# Patient Record
Sex: Female | Born: 1985 | Race: Black or African American | Hispanic: No | Smoking: Never smoker
Health system: Southern US, Community
[De-identification: ages and names within clinical notes are randomized; demographics above are authoritative.]

## PROBLEM LIST (undated history)

## (undated) DIAGNOSIS — O039 Complete or unspecified spontaneous abortion without complication: Secondary | ICD-10-CM

---
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
## 2007-04-13 ENCOUNTER — Emergency Department (HOSPITAL_COMMUNITY): Admission: EM | Admit: 2007-04-13 | Discharge: 2007-04-13 | Payer: Self-pay | Admitting: Emergency Medicine

## 2007-10-19 ENCOUNTER — Emergency Department (HOSPITAL_COMMUNITY): Admission: EM | Admit: 2007-10-19 | Discharge: 2007-10-20 | Payer: Self-pay | Admitting: Emergency Medicine

## 2008-06-19 ENCOUNTER — Emergency Department (HOSPITAL_COMMUNITY): Admission: EM | Admit: 2008-06-19 | Discharge: 2008-06-19 | Payer: Self-pay | Admitting: Emergency Medicine

## 2008-11-15 ENCOUNTER — Emergency Department (HOSPITAL_COMMUNITY): Admission: EM | Admit: 2008-11-15 | Discharge: 2008-11-15 | Payer: Self-pay | Admitting: *Deleted

## 2009-02-05 ENCOUNTER — Emergency Department (HOSPITAL_COMMUNITY): Admission: EM | Admit: 2009-02-05 | Discharge: 2009-02-05 | Payer: Self-pay | Admitting: Family Medicine

## 2009-03-01 IMAGING — CR DG CERVICAL SPINE COMPLETE 4+V
6 series · 6 of 6 positions shown · non-contrast
Comparison: None.

CLINICAL DATA: Trauma.

Cervical spine 6 views

[w c-spine lat]
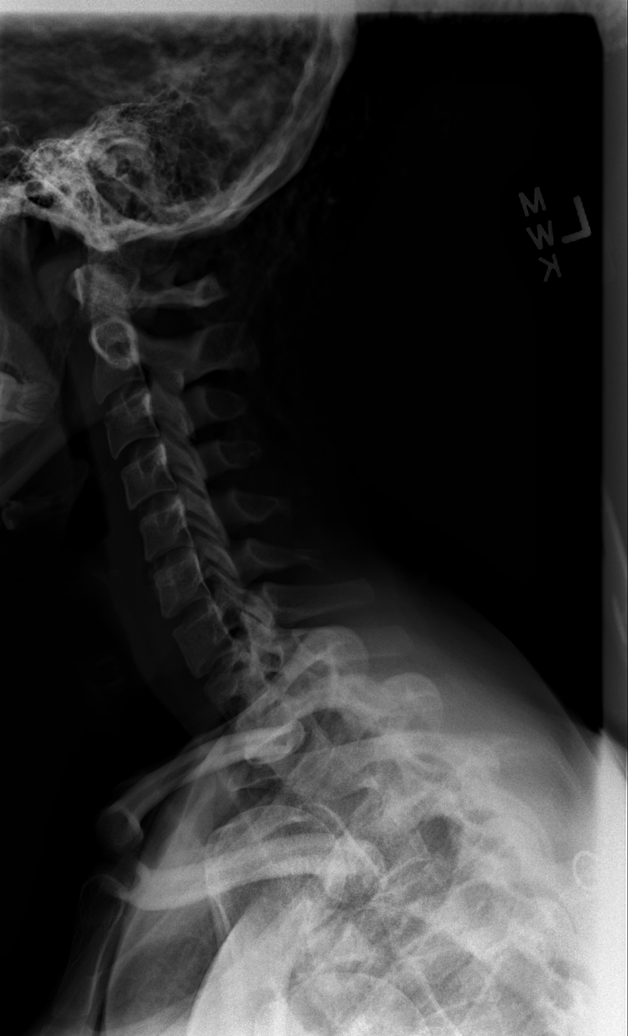

[w c-spine oblique (1 of 2)]
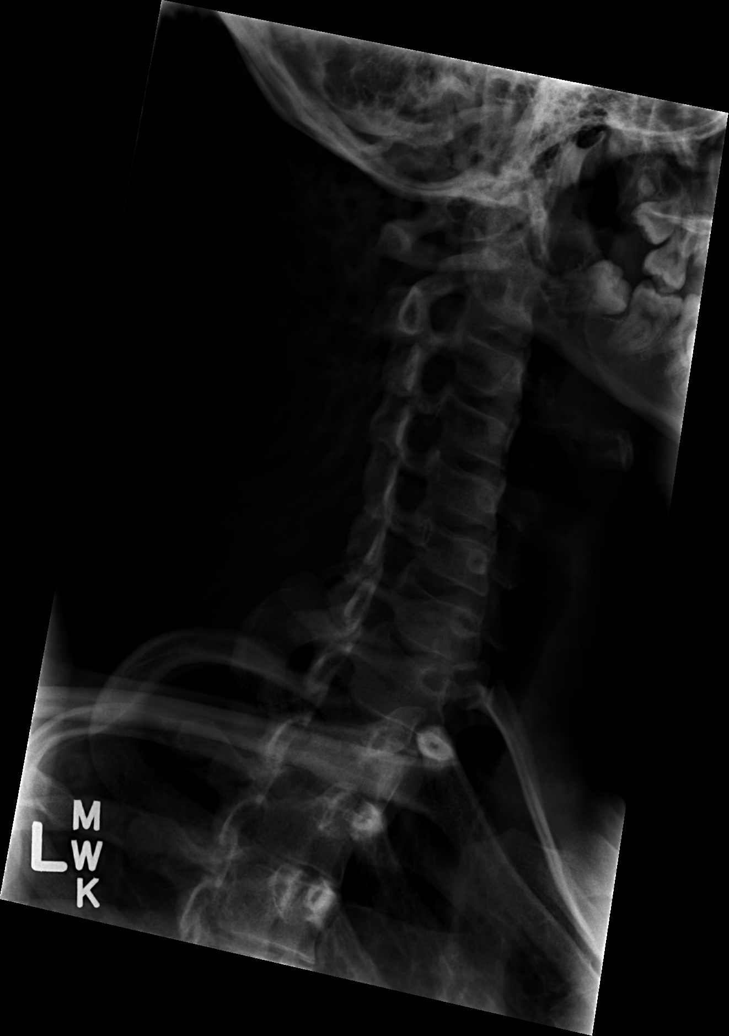

[w c-spine oblique (2 of 2)]
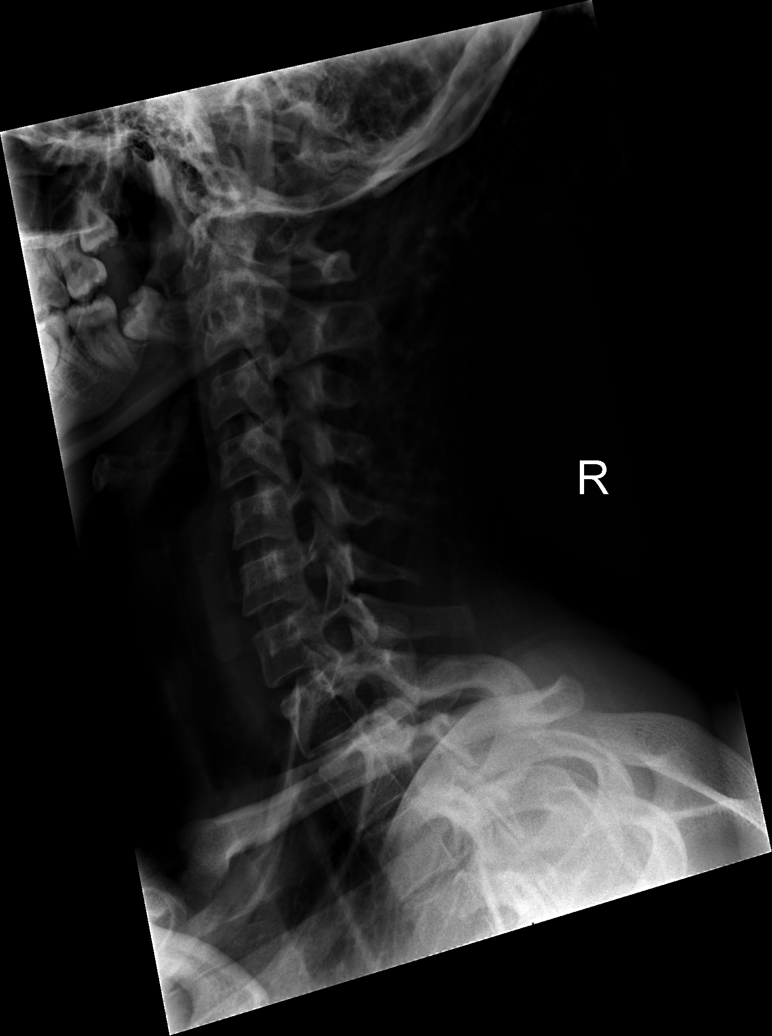

[w c-spine a.p.]
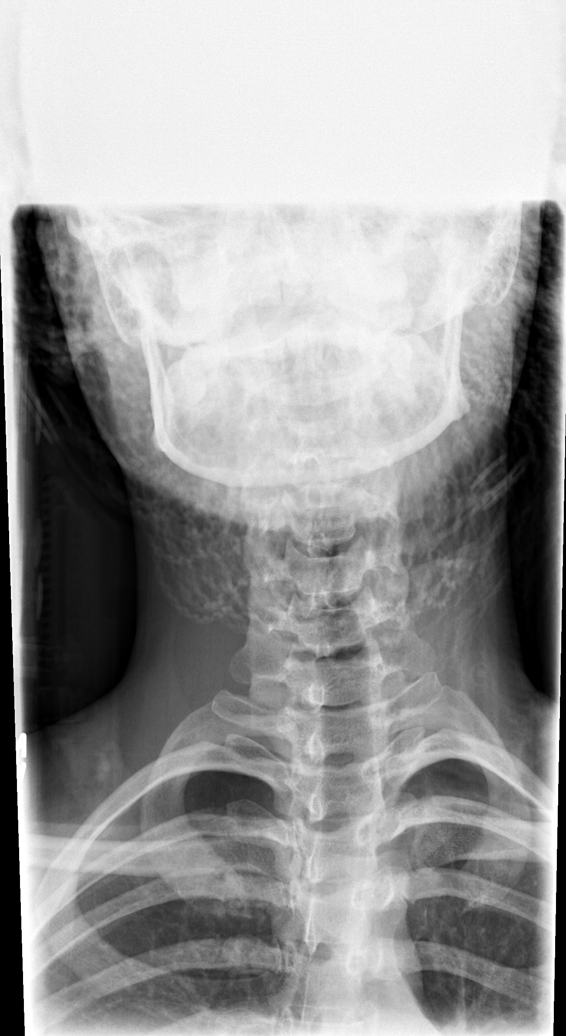

[w c-spine odontoid (1 of 2)]
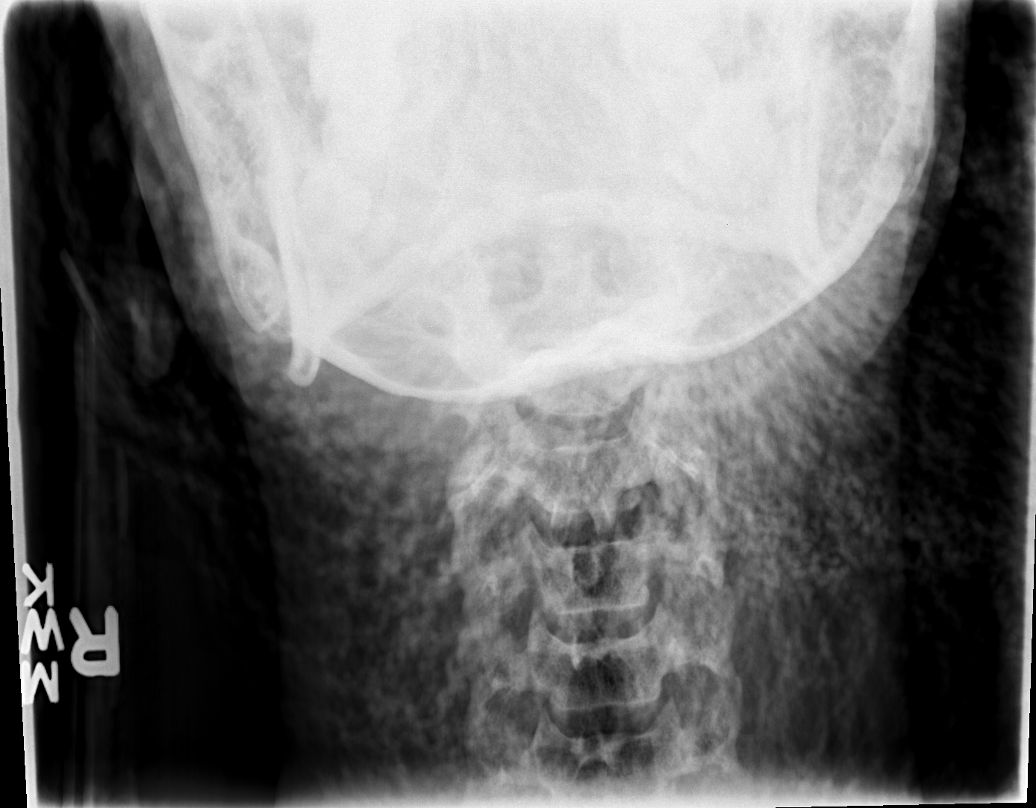

[w c-spine odontoid (2 of 2)]
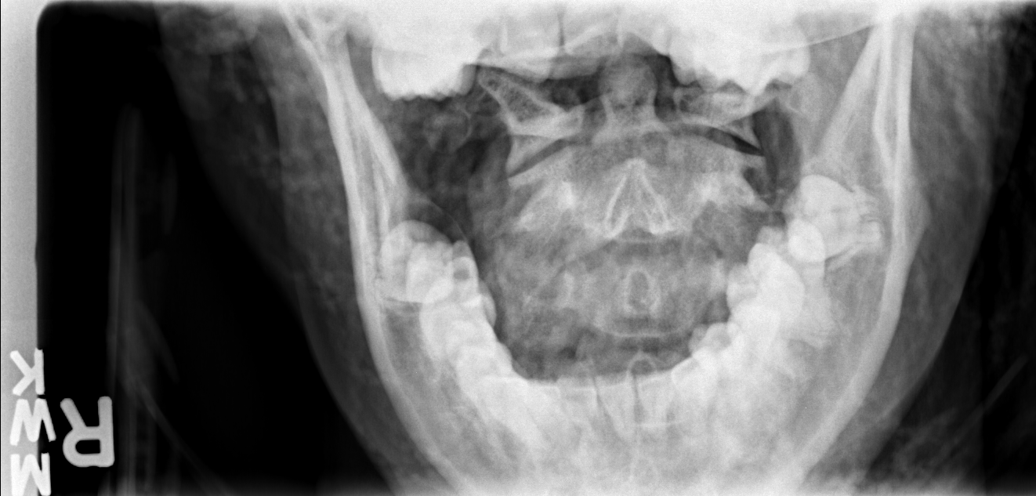

[6 of 6 positions shown; findings below may reference images not displayed]

FINDINGS: Prevertebral soft tissues normal. Oblique images partially artifact
limited. Superior aspect cervical spine also partially obscured by hair on
frontal view. Lateral masses symmetric. Body of C-2 intact. Odontoid process
intact.

IMPRESSION

2. Minimally limited oblique and frontal images as described.

## 2009-07-19 ENCOUNTER — Emergency Department (HOSPITAL_COMMUNITY): Admission: EM | Admit: 2009-07-19 | Discharge: 2009-07-19 | Payer: Self-pay | Admitting: Emergency Medicine

## 2009-08-01 ENCOUNTER — Emergency Department (HOSPITAL_COMMUNITY): Admission: EM | Admit: 2009-08-01 | Discharge: 2009-08-01 | Payer: Self-pay | Admitting: Emergency Medicine

## 2009-09-27 ENCOUNTER — Emergency Department (HOSPITAL_COMMUNITY): Admission: EM | Admit: 2009-09-27 | Discharge: 2009-09-27 | Payer: Self-pay | Admitting: Emergency Medicine

## 2011-03-26 LAB — STREP A DNA PROBE: Group A Strep Probe: NEGATIVE

## 2011-03-26 LAB — RAPID STREP SCREEN (MED CTR MEBANE ONLY): Streptococcus, Group A Screen (Direct): NEGATIVE

## 2011-03-29 LAB — POCT PREGNANCY, URINE: Preg Test, Ur: NEGATIVE

## 2011-03-29 LAB — RAPID STREP SCREEN (MED CTR MEBANE ONLY): Streptococcus, Group A Screen (Direct): NEGATIVE

## 2011-03-29 LAB — URINALYSIS, ROUTINE W REFLEX MICROSCOPIC
Glucose, UA: NEGATIVE mg/dL
Hgb urine dipstick: NEGATIVE

## 2011-04-07 LAB — POCT URINALYSIS DIP (DEVICE)
Bilirubin Urine: NEGATIVE
Ketones, ur: NEGATIVE mg/dL
Nitrite: POSITIVE — AB
Urobilinogen, UA: 0.2 mg/dL (ref 0.0–1.0)

## 2011-04-07 LAB — URINE CULTURE: Colony Count: >=100000

## 2011-04-07 LAB — WET PREP, GENITAL
Trich, Wet Prep: NONE SEEN
Yeast Wet Prep HPF POC: NONE SEEN

## 2011-09-22 LAB — URINE MICROSCOPIC-ADD ON

## 2011-09-22 LAB — URINALYSIS, ROUTINE W REFLEX MICROSCOPIC
Hgb urine dipstick: NEGATIVE
Ketones, ur: NEGATIVE
Protein, ur: NEGATIVE
Specific Gravity, Urine: 1.024
Urobilinogen, UA: 0.2
pH: 6

## 2011-09-30 LAB — STREP A DNA PROBE: Group A Strep Probe: NEGATIVE

## 2011-09-30 LAB — DIFFERENTIAL
Basophils Absolute: 0.4 — ABNORMAL HIGH
Basophils Relative: 2 — ABNORMAL HIGH
Eosinophils Absolute: 0
Eosinophils Relative: 0
Lymphocytes Relative: 9 — ABNORMAL LOW
Lymphs Abs: 1.6
Monocytes Absolute: 2 — ABNORMAL HIGH
Monocytes Relative: 11
Neutro Abs: 14.5 — ABNORMAL HIGH
Neutrophils Relative %: 78 — ABNORMAL HIGH

## 2011-09-30 LAB — CBC
HCT: 40.7
Hemoglobin: 13.6
MCHC: 33.5
MCV: 84.8
Platelets: 209
RBC: 4.8
RDW: 11.8
WBC: 18.5 — ABNORMAL HIGH

## 2011-09-30 LAB — RAPID STREP SCREEN (MED CTR MEBANE ONLY): Streptococcus, Group A Screen (Direct): NEGATIVE

## 2014-08-06 ENCOUNTER — Emergency Department (HOSPITAL_COMMUNITY)
Admission: EM | Admit: 2014-08-06 | Discharge: 2014-08-06 | Disposition: A | Discharge status: Home / Self Care | Payer: 59 | Attending: Emergency Medicine | Admitting: Emergency Medicine

## 2014-08-06 ENCOUNTER — Emergency Department (HOSPITAL_COMMUNITY): Payer: 59

## 2014-08-06 ENCOUNTER — Encounter (HOSPITAL_COMMUNITY): Payer: Self-pay | Admitting: Emergency Medicine

## 2014-08-06 DIAGNOSIS — R110 Nausea: Secondary | ICD-10-CM | POA: Diagnosis not present

## 2014-08-06 DIAGNOSIS — B9689 Other specified bacterial agents as the cause of diseases classified elsewhere: Secondary | ICD-10-CM | POA: Diagnosis not present

## 2014-08-06 DIAGNOSIS — O2390 Unspecified genitourinary tract infection in pregnancy, unspecified trimester: Secondary | ICD-10-CM | POA: Diagnosis not present

## 2014-08-06 DIAGNOSIS — A499 Bacterial infection, unspecified: Secondary | ICD-10-CM | POA: Insufficient documentation

## 2014-08-06 DIAGNOSIS — O9989 Other specified diseases and conditions complicating pregnancy, childbirth and the puerperium: Secondary | ICD-10-CM | POA: Insufficient documentation

## 2014-08-06 DIAGNOSIS — O200 Threatened abortion: Secondary | ICD-10-CM | POA: Diagnosis not present

## 2014-08-06 DIAGNOSIS — N939 Abnormal uterine and vaginal bleeding, unspecified: Secondary | ICD-10-CM

## 2014-08-06 DIAGNOSIS — N760 Acute vaginitis: Secondary | ICD-10-CM | POA: Insufficient documentation

## 2014-08-06 DIAGNOSIS — Z79899 Other long term (current) drug therapy: Secondary | ICD-10-CM | POA: Insufficient documentation

## 2014-08-06 DIAGNOSIS — Z331 Pregnant state, incidental: Secondary | ICD-10-CM

## 2014-08-06 DIAGNOSIS — Z3490 Encounter for supervision of normal pregnancy, unspecified, unspecified trimester: Secondary | ICD-10-CM

## 2014-08-06 LAB — CBC WITH DIFFERENTIAL/PLATELET
Basophils Absolute: 0 10*3/uL (ref 0.0–0.1)
Basophils Relative: 0 % (ref 0–1)
EOS ABS: 0 10*3/uL (ref 0.0–0.7)
Eosinophils Relative: 0 % (ref 0–5)
HCT: 39.7 % (ref 36.0–46.0)
Hemoglobin: 13.5 g/dL (ref 12.0–15.0)
LYMPHS ABS: 1.8 10*3/uL (ref 0.7–4.0)
Lymphocytes Relative: 16 % (ref 12–46)
MCH: 29.1 pg (ref 26.0–34.0)
MCHC: 34 g/dL (ref 30.0–36.0)
MCV: 85.6 fL (ref 78.0–100.0)
MONOS PCT: 6 % (ref 3–12)
Monocytes Absolute: 0.6 10*3/uL (ref 0.1–1.0)
NEUTROS PCT: 78 % — AB (ref 43–77)
Neutro Abs: 8.5 10*3/uL — ABNORMAL HIGH (ref 1.7–7.7)
Platelets: 240 10*3/uL (ref 150–400)
RBC: 4.64 MIL/uL (ref 3.87–5.11)
RDW: 12.6 % (ref 11.5–15.5)
WBC: 10.9 10*3/uL — ABNORMAL HIGH (ref 4.0–10.5)

## 2014-08-06 LAB — COMPREHENSIVE METABOLIC PANEL
ALK PHOS: 53 U/L (ref 39–117)
ALT: 21 U/L (ref 0–35)
ANION GAP: 13 (ref 5–15)
AST: 28 U/L (ref 0–37)
Albumin: 3.9 g/dL (ref 3.5–5.2)
BILIRUBIN TOTAL: 0.3 mg/dL (ref 0.3–1.2)
BUN: 6 mg/dL (ref 6–23)
CO2: 23 mEq/L (ref 19–32)
CREATININE: 0.76 mg/dL (ref 0.50–1.10)
Calcium: 9.7 mg/dL (ref 8.4–10.5)
Chloride: 105 mEq/L (ref 96–112)
GFR calc non Af Amer: >90 mL/min (ref >90)
GLUCOSE: 72 mg/dL (ref 70–99)
POTASSIUM: 3.8 meq/L (ref 3.7–5.3)
Sodium: 141 mEq/L (ref 137–147)
TOTAL PROTEIN: 7.6 g/dL (ref 6.0–8.3)

## 2014-08-06 LAB — HIV ANTIBODY (ROUTINE TESTING W REFLEX): HIV 1&2 Ab, 4th Generation: NONREACTIVE

## 2014-08-06 LAB — URINALYSIS, ROUTINE W REFLEX MICROSCOPIC
BILIRUBIN URINE: NEGATIVE
GLUCOSE, UA: NEGATIVE mg/dL
LEUKOCYTES UA: NEGATIVE
Nitrite: NEGATIVE
PROTEIN: NEGATIVE mg/dL
Specific Gravity, Urine: 1.021 (ref 1.005–1.030)
UROBILINOGEN UA: 0.2 mg/dL (ref 0.0–1.0)
pH: 5.5 (ref 5.0–8.0)

## 2014-08-06 LAB — WET PREP, GENITAL
Trich, Wet Prep: NONE SEEN
Yeast Wet Prep HPF POC: NONE SEEN

## 2014-08-06 LAB — HCG, QUANTITATIVE, PREGNANCY: hCG, Beta Chain, Quant, S: 9474 m[IU]/mL — ABNORMAL HIGH (ref <5)

## 2014-08-06 LAB — URINE MICROSCOPIC-ADD ON

## 2014-08-06 LAB — ABO/RH: ABO/RH(D): A POS

## 2014-08-06 LAB — POC URINE PREG, ED: Preg Test, Ur: POSITIVE — AB

## 2014-08-06 LAB — RPR

## 2014-08-06 MED ORDER — METRONIDAZOLE 500 MG PO TABS: 500.0000 mg | ORAL_TABLET | Freq: Two times a day (BID) | ORAL | Status: AC

## 2014-08-06 MED ORDER — PRENATAL COMPLETE 14-0.4 MG PO TABS: 1.0000 | ORAL_TABLET | Freq: Every day | ORAL | Status: AC

## 2014-08-06 NOTE — ED Notes (Signed)
Pelvic cart set up at bedside  

## 2014-08-06 NOTE — ED Notes (Signed)
Pt reports having abd cramps and irregular period. Last menstrual was 7/5 but it was heavier than normal for her and only lasted two days. Then had spotting last week. Pt wants pregnancy test. No acute distress noted at triage.

## 2014-08-06 NOTE — Discharge Instructions (Signed)
Your vaginal bleeding is considered a threatened miscarriage, but your ultrasound shows an early intrauterine pregnancy. Drink plenty of fluids, and start prenatal vitamins today. You had bacterial vaginosis on your swabs today, therefore start taking metronidazole as directed and finish the full course. Avoid intercourse during this course of medications. You need to see the women's outpatient clinic in 2 days for a repeat beta-HCG (pregnancy hormone) and prenatal care. Follow up with women's outpatient for future STD concerns or screenings. You have been tested for gonorrhea and chlamydia in the ER but the hospital will call you if lab is positive and direct you on any further treatment you may need. You were tested for HIV and Syphilis, and the hospital will call you if the lab is positive. Return to the ER for any changes or worsening symptoms, including large blood loss and passage of tissues from your vagina.   Bacterial Vaginosis Bacterial vaginosis is an infection of the vagina. It happens when too many of certain germs (bacteria) grow in the vagina. HOME CARE  Take your medicine as told by your doctor.  Finish your medicine even if you start to feel better.  Do not have sex until you finish your medicine and are better.  Tell your sex partner that you have an infection. They should see their doctor for treatment.  Practice safe sex. Use condoms. Have only one sex partner. GET HELP IF:  You are not getting better after 3 days of treatment.  You have more grey fluid (discharge) coming from your vagina than before.  You have more pain than before.  You have a fever. MAKE SURE YOU:   Understand these instructions.  Will watch your condition.  Will get help right away if you are not doing well or get worse. Document Released: 09/15/2008 Document Revised: 09/27/2013 Document Reviewed: 07/19/2013 [facility] Patient Information 2015 [place], [place]. This information is not intended to  replace advice given to you by your health care provider. Make sure you discuss any questions you have with your health care provider.  First Trimester of Pregnancy The first trimester of pregnancy is from week 1 until the end of week 12 (months 1 through 3). During this time, your baby will begin to develop inside you. At 6-8 weeks, the eyes and face are formed, and the heartbeat can be seen on ultrasound. At the end of 12 weeks, all the baby's organs are formed. Prenatal care is all the medical care you receive before the birth of your baby. Make sure you get good prenatal care and follow all of your doctor's instructions. HOME CARE  Medicines  Take medicine only as told by your doctor. Some medicines are safe and some are not during pregnancy.  Take your prenatal vitamins as told by your doctor.  Take medicine that helps you poop (stool softener) as needed if your doctor says it is okay. Diet  Eat regular, healthy meals.  Your doctor will tell you the amount of weight gain that is right for you.  Avoid raw meat and uncooked cheese.  If you feel sick to your stomach (nauseous) or throw up (vomit):  Eat 4 or 5 small meals a day instead of 3 large meals.  Try eating a few soda crackers.  Drink liquids between meals instead of during meals.  If you have a hard time pooping (constipation):  Eat high-fiber foods like fresh vegetables, fruit, and whole grains.  Drink enough fluids to keep your pee (urine) clear or pale  yellow. Activity and Exercise  Exercise only as told by your doctor. Stop exercising if you have cramps or pain in your lower belly (abdomen) or low back.  Try to avoid standing for long periods of time. Move your legs often if you must stand in one place for a long time.  Avoid heavy lifting.  Wear low-heeled shoes. Sit and stand up straight.  You can have sex unless your doctor tells you not to. Relief of Pain or Discomfort  Wear a good support bra if your  breasts are sore.  Take warm water baths (sitz baths) to soothe pain or discomfort caused by hemorrhoids. Use hemorrhoid cream if your doctor says it is okay.  Rest with your legs raised if you have leg cramps or low back pain.  Wear support hose if you have puffy, bulging veins (varicose veins) in your legs. Raise (elevate) your feet for 15 minutes, 3-4 times a day. Limit salt in your diet. Prenatal Care  Schedule your prenatal visits by the twelfth week of pregnancy.  Write down your questions. Take them to your prenatal visits.  Keep all your prenatal visits as told by your doctor. Safety  Wear your seat belt at all times when driving.  Make a list of emergency phone numbers. The list should include numbers for family, friends, the hospital, and police and fire departments. General Tips  Ask your doctor for a referral to a local prenatal class. Begin classes no later than at the start of month 6 of your pregnancy.  Ask for help if you need counseling or help with nutrition. Your doctor can give you advice or tell you where to go for help.  Do not use hot tubs, steam rooms, or saunas.  Do not douche or use tampons or scented sanitary pads.  Do not cross your legs for long periods of time.  Avoid litter boxes and soil used by cats.  Avoid all smoking, herbs, and alcohol. Avoid drugs not approved by your doctor.  Visit your dentist. At home, brush your teeth with a soft toothbrush. Be gentle when you floss. GET HELP IF:  You are dizzy.  You have mild cramps or pressure in your lower belly.  You have a nagging pain in your belly area.  You continue to feel sick to your stomach, throw up, or have watery poop (diarrhea).  You have a bad smelling fluid coming from your vagina.  You have pain with peeing (urination).  You have increased puffiness (swelling) in your face, hands, legs, or ankles. GET HELP RIGHT AWAY IF:   You have a fever.  You are leaking fluid from  your vagina.  You have spotting or bleeding from your vagina.  You have very bad belly cramping or pain.  You gain or lose weight rapidly.  You throw up blood. It may look like coffee grounds.  You are around people who have [place] measles, fifth disease, or chickenpox.  You have a very bad headache.  You have shortness of breath.  You have any kind of trauma, such as from a fall or a car accident. Document Released: 05/25/2008 Document Revised: 04/23/2014 Document Reviewed: 10/17/2013 [facility] Patient Information 2015 [place], [place]. This information is not intended to replace advice given to you by your health care provider. Make sure you discuss any questions you have with your health care provider.  Threatened Miscarriage A threatened miscarriage is when you have vaginal bleeding during your first 20 weeks of pregnancy but the  pregnancy has not ended. Your doctor will do tests to make sure you are still pregnant. The cause of the bleeding may not be known. This condition does not mean your pregnancy will end. It does increase the risk of it ending (complete miscarriage). HOME CARE   Make sure you keep all your doctor visits for prenatal care.  Get plenty of rest.  Do not have sex or use tampons if you have vaginal bleeding.  Do not douche.  Do not smoke or use drugs.  Do not drink alcohol.  Avoid caffeine. GET HELP IF:  You have light bleeding from your vagina.  You have belly pain or cramping.  You have a fever. GET HELP RIGHT AWAY IF:   You have heavy bleeding from your vagina.  You have clots of blood coming from your vagina.  You have bad pain or cramps in your low back or belly.  You have fever, chills, and bad belly pain. MAKE SURE YOU:   Understand these instructions.  Will watch your condition.  Will get help right away if you are not doing well or get worse. Document Released: 11/19/2008 Document Revised: 12/12/2013 Document Reviewed:  10/03/2013 [facility] Patient Information 2015 [place]. This information is not intended to replace advice given to you by your health care provider. Make sure you discuss any questions you have with your health care provider.  Vaginal Bleeding During Pregnancy, First Trimester A small amount of bleeding (spotting) from the vagina is relatively common in early pregnancy. It usually stops on its own. Various things may cause bleeding or spotting in early pregnancy. Some bleeding may be related to the pregnancy, and some may not. In most cases, the bleeding is normal and is not a problem. However, bleeding can also be a sign of something serious. Be sure to tell your health care provider about any vaginal bleeding right away. Some possible causes of vaginal bleeding during the first trimester include:  Infection or inflammation of the cervix.  Growths (polyps) on the cervix.  Miscarriage or threatened miscarriage.  Pregnancy tissue has developed outside of the uterus and in a fallopian tube (tubal pregnancy).  Tiny cysts have developed in the uterus instead of pregnancy tissue (molar pregnancy). HOME CARE INSTRUCTIONS  Watch your condition for any changes. The following actions may help to lessen any discomfort you are feeling:  Follow your health care provider's instructions for limiting your activity. If your health care provider orders bed rest, you may need to stay in bed and only get up to use the bathroom. However, your health care provider may allow you to continue light activity.  If needed, make plans for someone to help with your regular activities and responsibilities while you are on bed rest.  Keep track of the number of pads you use each day, how often you change pads, and how soaked (saturated) they are. Write this down.  Do not use tampons. Do not douche.  Do not have sexual intercourse or orgasms until approved by your health care provider.  If you pass any tissue  from your vagina, save the tissue so you can show it to your health care provider.  Only take over-the-counter or prescription medicines as directed by your health care provider.  Do not take aspirin because it can make you bleed.  Keep all follow-up appointments as directed by your health care provider. SEEK MEDICAL CARE IF:  You have any vaginal bleeding during any part of your pregnancy.  You have  cramps or labor pains.  You have a fever, not controlled by medicine. SEEK IMMEDIATE MEDICAL CARE IF:   You have severe cramps in your back or belly (abdomen).  You pass large clots or tissue from your vagina.  Your bleeding increases.  You feel light-headed or weak, or you have fainting episodes.  You have chills.  You are leaking fluid or have a gush of fluid from your vagina.  You pass out while having a bowel movement. MAKE SURE YOU:  Understand these instructions.  Will watch your condition.  Will get help right away if you are not doing well or get worse. Document Released: 09/16/2005 Document Revised: 12/12/2013 Document Reviewed: 08/14/2013 [facility] Patient Information 2015 [place], [place]. This information is not intended to replace advice given to you by your health care provider. Make sure you discuss any questions you have with your health care provider.

## 2014-08-06 NOTE — ED Provider Notes (Signed)
Medical screening examination/treatment/procedure(s) were performed by non-physician practitioner and as supervising physician I was immediately available for consultation/collaboration.   EKG Interpretation None      Devoria AlbeIva Jaquasha Carnevale, MD, Armando GangFACEP   Ward GivensIva L Capone Schwinn, MD 08/06/14 [phone]

## 2014-08-06 NOTE — ED Provider Notes (Signed)
CSN: [id number]     Arrival date & time 08/06/14  1038 History   First MD Initiated Contact with Patient 08/06/14 1109     Chief Complaint  Patient presents with  . Abdominal Pain     (Consider location/radiation/quality/duration/timing/severity/associated sxs/prior Treatment) HPI Comments: Electra Paladino is a 28 y.o. Female with a PMHx of a miscarriage last February, who presents to the ED today with complaints of irregular periods, intermittent crampy abd pain, and unsure of pregnancy status, but when she went to urinate after arrival she noticed bright red blood. States the abd pain was 8/10, intermittent, nonradiating, located in lower abd, with no known aggravating or alleviating factors, and began approx 2wks ago. States she had felt intermittently nauseated therefore she thought she was pregnant which was what prompted her to come today. Then upon arrival, she developed mild vaginal bleeding, less than menses, bright red, with a small clot. LMP 06/24/14. Has one sexual partner, has unprotected sex. States she's had vaginal discharge over the last few weeks but can't describe it and says it "comes and goes". Endorses dyspareunia 2 days ago, stating it was dull pain. Denies fevers, chills, HA, CP, SOB, diarrhea, constipation, urinary symptoms, melena, hematochezia, NSAID use, alcohol use, recent travel or changes in diet, myalgias, arthralgias, syncope, or lightheadedness. States this feels similar to her prior miscarriage.  Patient is a 28 y.o. female presenting with abdominal pain. The history is provided by the patient. No language interpreter was used.  Abdominal Pain Pain location:  LLQ and RLQ Pain quality: cramping   Pain radiates to:  Does not radiate Pain severity:  Moderate (8/10) Onset quality:  Gradual Duration:  2 weeks Timing:  Intermittent Progression:  Unchanged Chronicity:  New Context: not eating, not previous surgeries, not recent sexual activity, not sick contacts and  not suspicious food intake   Relieved by:  None tried Worsened by:  Nothing tried Ineffective treatments:  None tried Associated symptoms: nausea (intermittent, now resolved), vaginal bleeding and vaginal discharge   Associated symptoms: no chest pain, no chills, no constipation, no diarrhea, no dysuria, no fever, no flatus, no hematemesis, no hematochezia, no hematuria, no melena, no shortness of breath and no vomiting   Vaginal bleeding:    Quality:  Bright red   Severity:  Mild   Number of pads used:  1   Number of tampons used:  0   Onset quality:  Sudden   Duration:  1 hour   Progression:  Unchanged   Chronicity:  New Vaginal discharge:    Quality:  Unable to specify   Severity:  Mild   Onset quality:  Unable to specify   Duration: unsure.   Timing:  Sporadic   Progression:  Unable to specify   Chronicity:  New Risk factors: no NSAID use     History reviewed. No pertinent past medical history. History reviewed. No pertinent past surgical history. History reviewed. No pertinent family history. History  Substance Use Topics  . Smoking status: Not on file  . Smokeless tobacco: Not on file  . Alcohol Use: No   OB History   Grav Para Term Preterm Abortions TAB SAB Ect Mult Living                 Review of Systems  Constitutional: Negative for fever and chills.  Respiratory: Negative for shortness of breath.   Cardiovascular: Negative for chest pain.  Gastrointestinal: Positive for nausea (intermittent, now resolved) and abdominal pain. Negative for vomiting, diarrhea,  constipation, blood in stool, melena, hematochezia, abdominal distention, anal bleeding, rectal pain, flatus and hematemesis.  Genitourinary: Positive for vaginal bleeding, vaginal discharge, vaginal pain, menstrual problem and dyspareunia. Negative for dysuria, urgency, frequency, hematuria, flank pain, decreased urine volume and difficulty urinating.  Musculoskeletal: Negative for arthralgias, back pain,  myalgias and neck pain.  Skin: Negative for color change and pallor.  Neurological: Negative for dizziness, weakness and light-headedness.  Hematological: Does not bruise/bleed easily.  Psychiatric/Behavioral: Negative for confusion.  10 Systems reviewed and are negative for acute change except as noted in the HPI.     Allergies  Review of patient's allergies indicates no known allergies.  Home Medications   Prior to Admission medications   Medication Sig Start Date End Date Taking? Authorizing Provider  metroNIDAZOLE (FLAGYL) 500 MG tablet Take 1 tablet (500 mg total) by mouth 2 (two) times daily. One po bid x 7 days 08/06/14   Donnita FallsMercedes Strupp Camprubi-Soms, PA-C  Prenatal Vit-Fe Fumarate-FA (PRENATAL COMPLETE) 14-0.4 MG TABS Take 1 tablet by mouth daily after breakfast. 08/06/14   Donnita FallsMercedes Strupp Camprubi-Soms, PA-C   BP 146/99  Pulse 77  Temp(Src) 99.1 F (37.3 C) (Oral)  Resp 18  SpO2 100%  LMP 06/24/2014 Physical Exam  Nursing note and vitals reviewed. Constitutional: She is oriented to person, place, and time. Vital signs are normal. She appears well-developed and well-nourished.  Non-toxic appearance. She appears distressed.  Oral temp 99.47F, nontoxic, tearful and appears upset  HENT:  Head: Normocephalic and atraumatic.  Mouth/Throat: Mucous membranes are normal.  Eyes: Conjunctivae and EOM are normal. Right eye exhibits no discharge. Left eye exhibits no discharge.  Neck: Normal range of motion. Neck supple.  Cardiovascular: Normal rate, regular rhythm, normal heart sounds and intact distal pulses.  Exam reveals no gallop and no friction rub.   No murmur heard. Pulmonary/Chest: Effort normal and breath sounds normal. No respiratory distress. She has no wheezes. She has no rhonchi. She has no rales.  Abdominal: Soft. Normal appearance and bowel sounds are normal. She exhibits no distension. There is no tenderness. There is no rigidity, no rebound, no guarding, no CVA  tenderness, no tenderness at McBurney's point and negative Murphy's sign.  Soft, NT/ND, +BS throughout, no r/g/r, neg murphy's, neg mcburney's, neg CVA TTP  Genitourinary: Uterus normal. There is no rash, tenderness or lesion on the right labia. There is no rash, tenderness or lesion on the left labia. Cervix exhibits discharge. Cervix exhibits no motion tenderness and no friability. Right adnexum displays no mass, no tenderness and no fullness. Left adnexum displays no mass, no tenderness and no fullness. There is bleeding around the vagina. No erythema or tenderness around the vagina. No foreign body around the vagina. No signs of injury around the vagina. Vaginal discharge found.  No rashes, lesions, or tenderness to external genitalia. No erythema, injury, or tenderness to vaginal mucosa. Mucoid vaginal discharge and scant blood within vaginal vault. No adnexal masses, tenderness, or fullness. No CMT or cervical friability. Mucoid discharge from cervical os which is closed. Uterus non-deviated, mobile, nonTTP, and without enlargement.    Musculoskeletal: Normal range of motion.  Neurological: She is alert and oriented to person, place, and time.  Skin: Skin is warm, dry and intact. No rash noted.  Psychiatric: She has a normal mood and affect.    ED Course  Procedures (including critical care time) Labs Review Labs Reviewed  WET PREP, GENITAL - Abnormal; Notable for the following:    Clue Cells Wet  Prep HPF POC MODERATE (*)    WBC, Wet Prep HPF POC FEW (*)    All other components within normal limits  URINALYSIS, ROUTINE W REFLEX MICROSCOPIC - Abnormal; Notable for the following:    Hgb urine dipstick LARGE (*)    Ketones, ur >80 (*)    All other components within normal limits  CBC WITH DIFFERENTIAL - Abnormal; Notable for the following:    WBC 10.9 (*)    Neutrophils Relative % 78 (*)    Neutro Abs 8.5 (*)    All other components within normal limits  HCG, QUANTITATIVE, PREGNANCY -  Abnormal; Notable for the following:    hCG, Beta Chain, Quant, S 9474 (*)    All other components within normal limits  URINE MICROSCOPIC-ADD ON - Abnormal; Notable for the following:    Bacteria, UA FEW (*)    All other components within normal limits  POC URINE PREG, ED - Abnormal; Notable for the following:    Preg Test, Ur POSITIVE (*)    All other components within normal limits  GC/CHLAMYDIA PROBE AMP  URINE CULTURE  COMPREHENSIVE METABOLIC PANEL  RPR  HIV ANTIBODY (ROUTINE TESTING)  ABO/RH    Imaging Review US Ob Comp Less 14 Wks  08/06/2014   CLINICAL DATA:  Pregnant with vaginal bleeding. Ectopic verses miscarriage.  EXAM: OBSTETRIC <14 WK [place] AND TRANSVAGINAL OB US  TECHNIQUE: Both transabdominal and transvaginal ultrasound examinations were performed for complete evaluation of the gestation as well as the maternal uterus, adnexal regions, and pelvic cul-de-sac. Transvaginal technique was performed to assess early pregnancy.  COMPARISON:  None.  FINDINGS: Intrauterine gestational sac: Visualized/normal in shape.  Yolk sac:  Present  Embryo:  Absent  Cardiac Activity: Absent  Heart Rate: Not applicable bpm  MSD: 7.6  mm   5 w   4  d  [place] EDC: 04/04/2015  Maternal uterus/adnexae: Physiologic appearance of the ovaries. No subchorionic hemorrhage. Moderate amount of free fluid.  IMPRESSION: Uncomplicated single intrauterine pregnancy with gestational sac and yolk sac visualized. Fetal pole not visualized due to early dates.   Electronically Signed   By: Andreas Newport M.D.   On: 08/06/2014 13:09   US Ob Transvaginal  08/06/2014   CLINICAL DATA:  Pregnant with vaginal bleeding. Ectopic verses miscarriage.  EXAM: OBSTETRIC <14 WK [place] AND TRANSVAGINAL OB US  TECHNIQUE: Both transabdominal and transvaginal ultrasound examinations were performed for complete evaluation of the gestation as well as the maternal uterus, adnexal regions, and pelvic cul-de-sac. Transvaginal technique was performed  to assess early pregnancy.  COMPARISON:  None.  FINDINGS: Intrauterine gestational sac: Visualized/normal in shape.  Yolk sac:  Present  Embryo:  Absent  Cardiac Activity: Absent  Heart Rate: Not applicable bpm  MSD: 7.6  mm   5 w   4  d  [place] EDC: 04/04/2015  Maternal uterus/adnexae: Physiologic appearance of the ovaries. No subchorionic hemorrhage. Moderate amount of free fluid.  IMPRESSION: Uncomplicated single intrauterine pregnancy with gestational sac and yolk sac visualized. Fetal pole not visualized due to early dates.   Electronically Signed   By: Andreas Newport M.D.   On: 08/06/2014 13:09     EKG Interpretation None      MDM   Final diagnoses:  Threatened abortion in early pregnancy  Intrauterine pregnancy, incidental  BV (bacterial vaginosis)  Vaginal bleeding     28y/o female with irregular menses who then developed vaginal bleeding after arrival. Concerned for miscarriage. Will check basic labs,  U/A, Upreg, pelvic exam with wet prep, GC/CT swab, and STD panel. Likely will need vaginal U/S either to r/o pelvic conditions or to r/o ectopic vs retained products of conception. Will get IV started in case vaginal bleeding becomes unstable, but pt with stable VS at this time. Will monitor.  12:45 PM Upreg positive, will proceed with U/S. Pelvic exam demonstrates mild amount of mucoid discharge with closed cervical os. No concerning findings that would indicate GC/CT.  1:50 PM Wet prep showing BV, will rx metronidazole 500 BIDx 7d. CMP WNL, CBC with diff WNL with stable H/H and stable VS, no concern for large volume blood loss. U/A showing leuk neg, nitrite neg, few bacteria and 0-2 WBC, likely related to BV therefore will not treat today and will send for culture, doubt UTI. ABO/Rh is A+, no rhogam necessary today. Quant Hcg [id number], discussed the need to repeat this in 2 days to watch for appropriate rise. Will have pt f/up at [facility], and advised her to start prenatals and  encouraged fluids. I explained the diagnosis and have given explicit precautions to return to the ER including for any other new or worsening symptoms. The patient understands and accepts the medical plan as it's been dictated and I have answered their questions. Discharge instructions concerning home care and prescriptions have been given. The patient is STABLE and is discharged to home in good condition.   BP 127/76  Pulse 82  Temp(Src) 99.1 F (37.3 C) (Oral)  Resp 16  SpO2 100%  LMP 06/24/2014  Meds ordered this encounter  Medications  . Prenatal Vit-Fe Fumarate-FA (PRENATAL COMPLETE) 14-0.4 MG TABS    Sig: Take 1 tablet by mouth daily after breakfast.    Dispense:  30 each    Refill:  2    Order Specific Question:  Supervising Provider    Answer:  Eber Hong D [[id number]]  . metroNIDAZOLE (FLAGYL) 500 MG tablet    Sig: Take 1 tablet (500 mg total) by mouth 2 (two) times daily. One po bid x 7 days    Dispense:  14 tablet    Refill:  0    Order Specific Question:  Supervising Provider    Answer:  Eber Hong D [[id number]]     Liala Codispoti Butler [place] Camprubi-Soms, PA-C 08/06/14 1402

## 2014-08-06 NOTE — ED Notes (Signed)
Pt. Reports irregular menstrual periods, N/V, and abdominal cramping. States last year she had a miscarriage and states symptoms are similar. Pt. Anxious. Reports having vaginal bleeding now.

## 2014-08-07 LAB — URINE CULTURE
Colony Count: 45000
Special Requests: NORMAL

## 2014-08-07 LAB — GC/CHLAMYDIA PROBE AMP
CT Probe RNA: NEGATIVE
GC Probe RNA: NEGATIVE

## 2014-08-08 ENCOUNTER — Other Ambulatory Visit: Payer: 59

## 2014-08-08 DIAGNOSIS — N939 Abnormal uterine and vaginal bleeding, unspecified: Secondary | ICD-10-CM

## 2014-08-09 ENCOUNTER — Other Ambulatory Visit: Payer: Self-pay | Admitting: Advanced Practice Midwife

## 2014-08-09 ENCOUNTER — Encounter: Payer: Self-pay | Admitting: *Deleted

## 2014-08-09 DIAGNOSIS — O3680X1 Pregnancy with inconclusive fetal viability, fetus 1: Secondary | ICD-10-CM

## 2014-08-09 LAB — HCG, QUANTITATIVE, PREGNANCY: HCG, BETA CHAIN, QUANT, S: 17925 m[IU]/mL

## 2014-08-09 NOTE — Progress Notes (Signed)
Appropriate rise in quants. IUP confirmed. Offer viability/dating US in ~7 days or [facility]

## 2014-08-09 NOTE — Progress Notes (Addendum)
Called pt and informed her of rise in hormone level indicating she has a viable pregnancy. She may have ultrasound next week (if desired) to establish her due date. Pt stated she would like that. I scheduled the US appt for 8/27 @ 0945 and advised pt.  Pt would like to begin prenatal care in this office. I advised her that we are not taking pts that are less than 15 wks at this time, however we do recommend that she begin care before that if possible. I offered her to begin prenatal care at the [place] location [place] and she agreed. I stated that I will send a message to that office and she will receive a call with appt details. Pt also asked if she can be written out of work for the first trimester because she has a high risk pregnancy and a very stressful job. She stated that she just lost a pregnancy earlier this year and does not want to lose another. I advised pt that she may discuss further with the provider once she begins prenatal care. At this time, there is no medical reason to keep her out of work. She then stated that she had stayed out of work this week and asked for a letter of excuse for her job. I stated that I can prepare a letter to excuse her from work on 08/06/14 because she was seen @ the ED. The provider notes did not state that she was not able to work after the ED visit. Pt voiced understanding of all information and instructions given and will call back tomorrow with the fax number for her job so that we can send the letter. Letter composed in EPIC and ready for faxing.

## 2014-08-13 ENCOUNTER — Encounter: Payer: Self-pay | Admitting: *Deleted

## 2014-08-15 ENCOUNTER — Emergency Department (HOSPITAL_COMMUNITY)
Admission: EM | Admit: 2014-08-15 | Discharge: 2014-08-15 | Disposition: A | Discharge status: Home / Self Care | Payer: 59 | Attending: Emergency Medicine | Admitting: Emergency Medicine

## 2014-08-15 ENCOUNTER — Emergency Department (HOSPITAL_COMMUNITY): Payer: 59

## 2014-08-15 ENCOUNTER — Encounter (HOSPITAL_COMMUNITY): Payer: Self-pay | Admitting: Emergency Medicine

## 2014-08-15 DIAGNOSIS — N898 Other specified noninflammatory disorders of vagina: Secondary | ICD-10-CM | POA: Diagnosis present

## 2014-08-15 DIAGNOSIS — Z792 Long term (current) use of antibiotics: Secondary | ICD-10-CM | POA: Insufficient documentation

## 2014-08-15 DIAGNOSIS — Z79899 Other long term (current) drug therapy: Secondary | ICD-10-CM | POA: Diagnosis not present

## 2014-08-15 DIAGNOSIS — O039 Complete or unspecified spontaneous abortion without complication: Secondary | ICD-10-CM | POA: Diagnosis not present

## 2014-08-15 HISTORY — DX: Complete or unspecified spontaneous abortion without complication: O03.9

## 2014-08-15 LAB — CBC WITH DIFFERENTIAL/PLATELET
BASOS PCT: 0 % (ref 0–1)
Basophils Absolute: 0 10*3/uL (ref 0.0–0.1)
Eosinophils Absolute: 0.1 10*3/uL (ref 0.0–0.7)
Eosinophils Relative: 1 % (ref 0–5)
HCT: 36.3 % (ref 36.0–46.0)
HEMOGLOBIN: 12.4 g/dL (ref 12.0–15.0)
LYMPHS ABS: 2.6 10*3/uL (ref 0.7–4.0)
Lymphocytes Relative: 26 % (ref 12–46)
MCH: 28.8 pg (ref 26.0–34.0)
MCHC: 34.2 g/dL (ref 30.0–36.0)
MCV: 84.4 fL (ref 78.0–100.0)
MONOS PCT: 9 % (ref 3–12)
Monocytes Absolute: 0.9 10*3/uL (ref 0.1–1.0)
NEUTROS PCT: 64 % (ref 43–77)
Neutro Abs: 6.4 10*3/uL (ref 1.7–7.7)
Platelets: 248 10*3/uL (ref 150–400)
RBC: 4.3 MIL/uL (ref 3.87–5.11)
RDW: 12.7 % (ref 11.5–15.5)
WBC: 9.9 10*3/uL (ref 4.0–10.5)

## 2014-08-15 LAB — HCG, QUANTITATIVE, PREGNANCY: hCG, Beta Chain, Quant, S: 34916 m[IU]/mL — ABNORMAL HIGH (ref <5)

## 2014-08-15 LAB — TYPE AND SCREEN
ABO/RH(D): A POS
Antibody Screen: NEGATIVE

## 2014-08-15 MED ORDER — MORPHINE SULFATE 4 MG/ML IJ SOLN
4.0000 mg | Freq: Once | INTRAMUSCULAR | Status: AC
  Administered 2014-08-15: 4 mg via INTRAVENOUS
  Filled 2014-08-15: qty 1

## 2014-08-15 MED ORDER — HYDROCODONE-ACETAMINOPHEN 5-325 MG PO TABS
1.0000 | ORAL_TABLET | Freq: Once | ORAL | Status: AC
  Administered 2014-08-15: 1 via ORAL
  Filled 2014-08-15: qty 1

## 2014-08-15 MED ORDER — HYDROCODONE-ACETAMINOPHEN 5-325 MG PO TABS: 1.0000 | ORAL_TABLET | Freq: Four times a day (QID) | ORAL | Status: AC | PRN

## 2014-08-15 NOTE — ED Notes (Signed)
Patient still off the unit for testing 

## 2014-08-15 NOTE — Discharge Instructions (Signed)
Miscarriage A miscarriage is the sudden loss of an unborn baby (fetus) before the 20th week of pregnancy. Most miscarriages happen in the first 3 months of pregnancy. Sometimes, it happens before a woman even knows she is pregnant. A miscarriage is also called a "spontaneous miscarriage" or "early pregnancy loss." Having a miscarriage can be an emotional experience. Talk with your caregiver about any questions you may have about miscarrying, the grieving process, and your future pregnancy plans. CAUSES   Problems with the fetal chromosomes that make it impossible for the baby to develop normally. Problems with the baby's genes or chromosomes are most often the result of errors that occur, by chance, as the embryo divides and grows. The problems are not inherited from the parents.  Infection of the cervix or uterus.   Hormone problems.   Problems with the cervix, such as having an incompetent cervix. This is when the tissue in the cervix is not strong enough to hold the pregnancy.   Problems with the uterus, such as an abnormally shaped uterus, uterine fibroids, or congenital abnormalities.   Certain medical conditions.   Smoking, drinking alcohol, or taking illegal drugs.   Trauma.  Often, the cause of a miscarriage is unknown.  SYMPTOMS   Vaginal bleeding or spotting, with or without cramps or pain.  Pain or cramping in the abdomen or lower back.  Passing fluid, tissue, or blood clots from the vagina. DIAGNOSIS  Your caregiver will perform a physical exam. You may also have an ultrasound to confirm the miscarriage. Blood or urine tests may also be ordered. TREATMENT   Sometimes, treatment is not necessary if you naturally pass all the fetal tissue that was in the uterus. If some of the fetus or placenta remains in the body (incomplete miscarriage), tissue left behind may become infected and must be removed. Usually, a dilation and curettage (D and C) procedure is performed.  During a D and C procedure, the cervix is widened (dilated) and any remaining fetal or placental tissue is gently removed from the uterus.  Antibiotic medicines are prescribed if there is an infection. Other medicines may be given to reduce the size of the uterus (contract) if there is a lot of bleeding.  If you have Rh negative blood and your baby was Rh positive, you will need a Rh immunoglobulin shot. This shot will protect any future baby from having Rh blood problems in future pregnancies. HOME CARE INSTRUCTIONS   Your caregiver may order bed rest or may allow you to continue light activity. Resume activity as directed by your caregiver.  Have someone help with home and family responsibilities during this time.   Keep track of the number of sanitary pads you use each day and how soaked (saturated) they are. Write down this information.   Do not use tampons. Do not douche or have sexual intercourse until approved by your caregiver.   Only take over-the-counter or prescription medicines for pain or discomfort as directed by your caregiver.   Do not take aspirin. Aspirin can cause bleeding.   Keep all follow-up appointments with your caregiver.   If you or your partner have problems with grieving, talk to your caregiver or seek counseling to help cope with the pregnancy loss. Allow enough time to grieve before trying to get pregnant again.  SEEK IMMEDIATE MEDICAL CARE IF:   You have severe cramps or pain in your back or abdomen.  You have a fever.  You pass large blood clots (walnut-sized   or larger) ortissue from your vagina. Save any tissue for your caregiver to inspect.   Your bleeding increases.   You have a thick, bad-smelling vaginal discharge.  You become lightheaded, weak, or you faint.   You have chills.  MAKE SURE YOU:  Understand these instructions.  Will watch your condition.  Will get help right away if you are not doing well or get  worse. Document Released: 06/02/2001 Document Revised: 04/03/2013 Document Reviewed: 01/26/2012 [employer] Patient Information 2015 [employer]. This information is not intended to replace advice given to you by your health care provider. Make sure you discuss any questions you have with your health care provider.  

## 2014-08-15 NOTE — ED Notes (Signed)
D/c I/v 

## 2014-08-15 NOTE — ED Provider Notes (Signed)
CSN: [id number]     Arrival date & time 08/15/14  0505 History   First MD Initiated Contact with Patient 08/15/14 [phone]     Chief Complaint  Patient presents with  . Vaginal Bleeding     (Consider location/radiation/quality/duration/timing/severity/associated sxs/prior Treatment) HPI  This is a 28 year old G50P0 female approximately [redacted] weeks pregnant who presents with vaginal bleeding. Patient reports onset of vaginal bleeding and abdominal cramping earlier this evening. She reports she has passed several large blood clots. She denies any syncope or dizziness. She reports lower crampy abdominal pain that is nonradiating. She is due to see OB  Tomorrow. History of spontaneous miscarriage. Was evaluated on August 17 and had a single intrauterine pregnancy at that time.  Past Medical History  Diagnosis Date  . Miscarriage    History reviewed. No pertinent past surgical history. History reviewed. No pertinent family history. History  Substance Use Topics  . Smoking status: Never Smoker   . Smokeless tobacco: Not on file  . Alcohol Use: No   OB History   Grav Para Term Preterm Abortions TAB SAB Ect Mult Living                 Review of Systems  Constitutional: Negative for fever.  Respiratory: Negative for cough, chest tightness and shortness of breath.   Cardiovascular: Negative for chest pain.  Gastrointestinal: Positive for abdominal pain. Negative for nausea and vomiting.  Genitourinary: Positive for vaginal bleeding. Negative for dysuria.  Neurological: Negative for headaches.  All other systems reviewed and are negative.     Allergies  Review of patient's allergies indicates no known allergies.  Home Medications   Prior to Admission medications   Medication Sig Start Date End Date Taking? Authorizing Provider  Prenatal Vit-Fe Fumarate-FA (PRENATAL COMPLETE) 14-0.4 MG TABS Take 1 tablet by mouth daily after breakfast. 08/06/14  Yes Mercedes Strupp Camprubi-Soms, PA-C   HYDROcodone-acetaminophen (NORCO/VICODIN) 5-325 MG per tablet Take 1-2 tablets by mouth every 6 (six) hours as needed for moderate pain or severe pain. 08/15/14   Shon Baton, MD  metroNIDAZOLE (FLAGYL) 500 MG tablet Take 1 tablet (500 mg total) by mouth 2 (two) times daily. One po bid x 7 days 08/06/14   Donnita Falls Camprubi-Soms, PA-C   BP 115/65  Pulse 69  Temp(Src) 98.5 F (36.9 C) (Oral)  Resp 18  Ht  (1.6 m)  Wt 130 lb (58.968 kg)  BMI 23.03 kg/m2  SpO2 100%  LMP 06/24/2014 Physical Exam  Nursing note and vitals reviewed. Constitutional: She is oriented to person, place, and time. She appears well-developed and well-nourished. No distress.  HENT:  Head: Normocephalic and atraumatic.  Cardiovascular: Normal rate, regular rhythm and normal heart sounds.   Pulmonary/Chest: Effort normal and breath sounds normal. No respiratory distress. She has no wheezes.  Abdominal: Soft. Bowel sounds are normal. There is tenderness. There is no rebound and no guarding.  Genitourinary:  Normal external vaginal exam, large amount of gross blood noted in the vaginal vault, evacuated multiple blood clots with ringed forceps, cervical os open  Neurological: She is alert and oriented to person, place, and time.  Skin: Skin is warm and dry.  Psychiatric: She has a normal mood and affect.    ED Course  Procedures (including critical care time) Labs Review Labs Reviewed  HCG, QUANTITATIVE, PREGNANCY - Abnormal; Notable for the following:    hCG, Beta Chain, Quant, S [id number] (*)    All other components within normal limits  CBC WITH DIFFERENTIAL  TYPE AND SCREEN    Imaging Review US Ob Comp Less 14 Wks  08/15/2014   CLINICAL DATA:  Vaginal bleeding  EXAM: OBSTETRIC <14 WK [place] AND TRANSVAGINAL OB US  TECHNIQUE: Study was performed transabdominally to optimize pelvic field of view evaluation and transvaginally to optimize internal visceral architecture evaluation.  COMPARISON:  August 06, 2014  FINDINGS: The previously noted gestational sac is no longer appreciable. The endometrium currently is thickened with complex echogenic material within the endometrium consistent with hemorrhage. Complex fluid collection in the endometrium measures 1.8 x 1.4 x 1.5 cm. A degree of retained products of conception cannot be excluded on this study. Uterus otherwise appears unremarkable. Uterus is retroverted. It measures 7.3 x 5.7 x 7.0 cm.  The right ovary measures 2.0 x 1.3 x 1.3 cm. Left ovary measures 3.5 x 2.1 x 2.0 cm. There is a follicle/corpus luteum in the left ovary measuring 1.6 x 0.7 cm. No other extrauterine pelvic mass. A small amount of free pelvic fluid may indicate recent ovarian cyst leakage.  IMPRESSION: Findings consistent with spontaneous abortion. Gestational sac no longer appreciable. There is complex material throughout the endometrium consistent with hemorrhage in possible retained products of conception. Close clinical evaluation with beta HCG correlation advised. A repeat ultrasound in 2-3 days to further assess the appearance of the endometrium may be warranted given this circumstance as well.   Electronically Signed   By: Bretta Bang M.D.   On: 08/15/2014 07:27   US Ob Transvaginal  08/15/2014   CLINICAL DATA:  Vaginal bleeding  EXAM: OBSTETRIC <14 WK [place] AND TRANSVAGINAL OB US  TECHNIQUE: Study was performed transabdominally to optimize pelvic field of view evaluation and transvaginally to optimize internal visceral architecture evaluation.  COMPARISON:  August 06, 2014  FINDINGS: The previously noted gestational sac is no longer appreciable. The endometrium currently is thickened with complex echogenic material within the endometrium consistent with hemorrhage. Complex fluid collection in the endometrium measures 1.8 x 1.4 x 1.5 cm. A degree of retained products of conception cannot be excluded on this study. Uterus otherwise appears unremarkable. Uterus is retroverted. It  measures 7.3 x 5.7 x 7.0 cm.  The right ovary measures 2.0 x 1.3 x 1.3 cm. Left ovary measures 3.5 x 2.1 x 2.0 cm. There is a follicle/corpus luteum in the left ovary measuring 1.6 x 0.7 cm. No other extrauterine pelvic mass. A small amount of free pelvic fluid may indicate recent ovarian cyst leakage.  IMPRESSION: Findings consistent with spontaneous abortion. Gestational sac no longer appreciable. There is complex material throughout the endometrium consistent with hemorrhage in possible retained products of conception. Close clinical evaluation with beta HCG correlation advised. A repeat ultrasound in 2-3 days to further assess the appearance of the endometrium may be warranted given this circumstance as well.   Electronically Signed   By: Bretta Bang M.D.   On: 08/15/2014 07:27     EKG Interpretation None      MDM   Final diagnoses:  Miscarriage    Patient presents with vaginal bleeding in the setting of recently confirmed IUP.  Denies dizziness or syncope.  Active passage of clot on my exam with manual evacuation and open os.  Suspect spontaneous AB.  Patient is RH+.  DOes not require rhogam.  Patient given pain medication.  US obtained with confirm - [place] with evidence of likely spontaneous AB.  Discussed results with patient.  Patient's bleeding has subsided and Hbg is  12.4 down from 13.5.  Discussed with patient pain control and expectant management.  Patient is to follow-up with OB as scheduled for further eval.  GIven return precautions including worsening bleeding (>2 pads/hr), worsening pain.  Patient stated understanding.  After history, exam, and medical workup I feel the patient has been appropriately medically screened and is safe for discharge home. Pertinent diagnoses were discussed with the patient. Patient was given return precautions.     Shon Baton, MD 08/15/14 [id number]

## 2014-08-15 NOTE — ED Notes (Signed)
Boyfriend present at the nursing station, informed him that the patient is currrently in ultrasound, and that her account is private. He is currently the only person she has authorized to visit.  He is awaiting her return in her room.

## 2014-08-15 NOTE — ED Notes (Signed)
Charge nurse called to room as patient requested.  Patient prefers only experienced staff at the bedside.

## 2014-08-15 NOTE — ED Notes (Signed)
Pt c/o increased bleeding and pain.

## 2014-08-15 NOTE — ED Notes (Signed)
Patient reports she noticed she was passing large blood clots and was having stomach cramps.  She reports she was having these same symptoms the last time she miscarried.  She is unsure of how far along she is, and that she hasn't seen an OB physician yet.

## 2014-08-15 NOTE — ED Notes (Signed)
Pelvic cart at the bedside 

## 2014-08-15 NOTE — ED Notes (Signed)
Brought Patient towels

## 2014-08-15 NOTE — ED Notes (Signed)
Called lab and blood bank to follow up with type and screen.  It is currently in process. Reported this to Dr. Wilkie Aye. Also discussed with Charge RN that the patient is concerned about lack of empathy from staff, and requests to see charge again.  Lequita Halt will see the patient once she returns from ultrasound.

## 2014-08-15 NOTE — ED Notes (Signed)
Pt stated "are you expecting me to leave while passing clots, this is poor care."  This RN advised that this was a normal process and that we would send her home with extra pads until she got in to see her OB.  Pt and family were extremely unreceptive to this RN and Toni Amend, RN to the idea of having to be discharge.  MD Woffard advised of the pt concerns.  Prior to MD going to bedside pt is leaving still wearing her hospital gown.  Pt left a clot on the floor for nursing staff prior to her leaving the ED when she was provided green pads to use while she was in the bed.

## 2014-08-15 NOTE — ED Notes (Signed)
Gave Patient towels, wash cloths, basin, body wash, tooth brush and tooth paste with pad and underwear.

## 2014-08-15 NOTE — ED Notes (Signed)
Boyfriend inquires to go to ultrasound with the patient. Called to see if possible.  Ultrasound reports patient will be returning soon.

## 2014-08-15 NOTE — ED Notes (Signed)
Patient requested to have account made private.  Reviewed policy for limited visitors and password is last 4 of CSN number: [id number].  She acknowledges.

## 2014-08-16 ENCOUNTER — Ambulatory Visit (HOSPITAL_COMMUNITY): Payer: 59 | Attending: Advanced Practice Midwife

## 2014-08-28 ENCOUNTER — Telehealth: Payer: Self-pay | Admitting: *Deleted

## 2014-08-28 NOTE — Telephone Encounter (Signed)
Marleda from radiology scheduling called stating that this pt was attempting to reschedule her missed [place] appt from 8/27. Leticia Penna was not sure it is necessary because pt was seen @ [facility] on 8/26 and found to have miscarriage. After review of pt's EMR, I informed Leticia Penna that pt should have the [place] rescheduled because her scan on 8/26 while at the ED showed the possibility of retained products and pt did not have follow up care scheduled. Leticia Penna will call pt to schedule [place] appt.

## 2016-06-24 IMAGING — US US OB TRANSVAGINAL
1 series · 14 of 28 positions shown · non-contrast
Comparison: None.

CLINICAL DATA: Pregnant with vaginal bleeding. Ectopic verses
miscarriage.

EXAM:
OBSTETRIC <14 WK US AND TRANSVAGINAL OB US
TECHNIQUE: Both transabdominal and transvaginal ultrasound examinations were
performed for complete evaluation of the gestation as well as the
maternal uterus, adnexal regions, and pelvic cul-de-sac.
Transvaginal technique was performed to assess early pregnancy.

[Series 1: us ob transvaginal · 0.22mm/px · 14 of 41 slices shown]
[im 2/41]
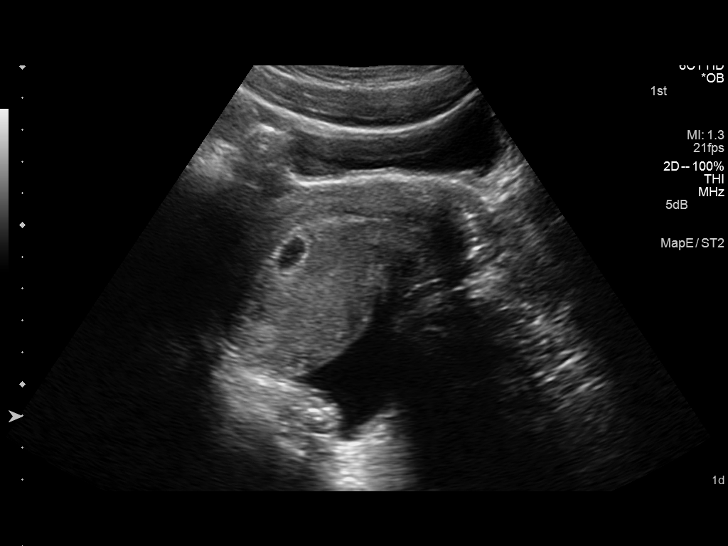
[im 5/41]
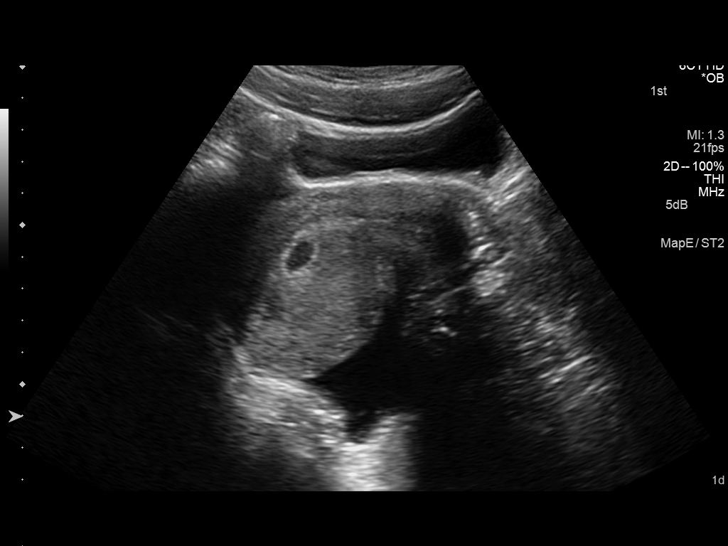
[im 8/41]
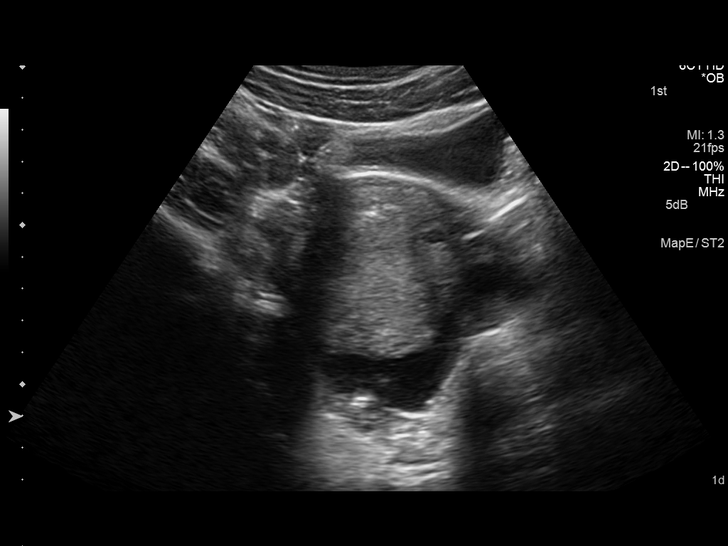
[im 11/41]
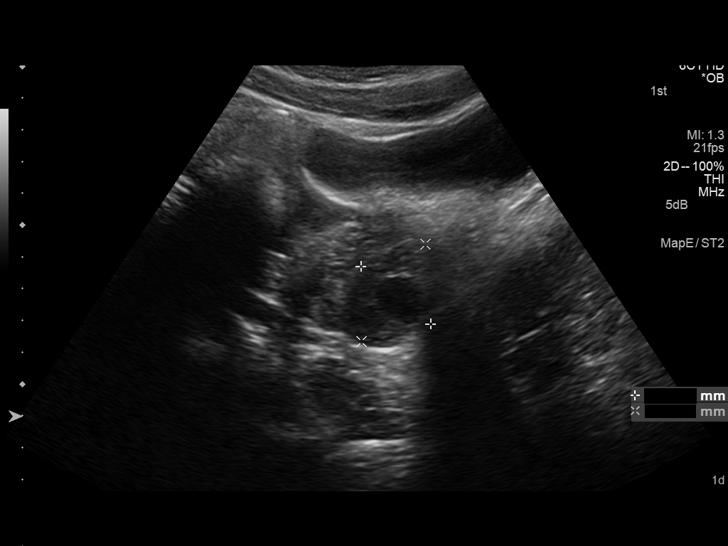
[im 14/41]
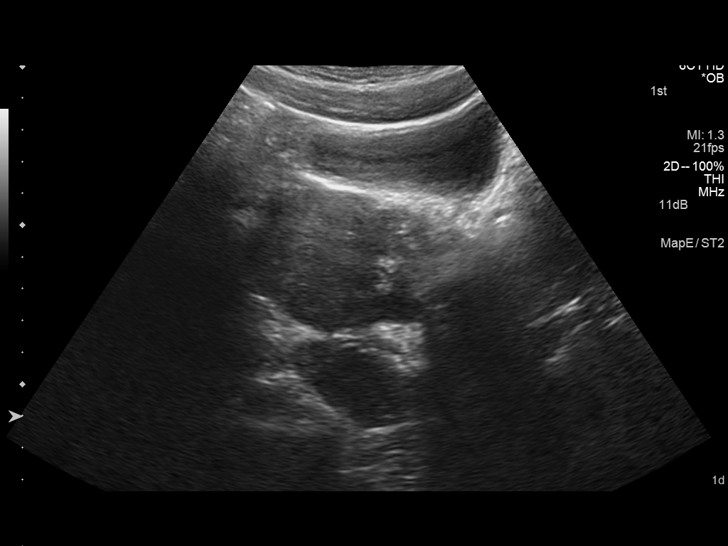
[im 17/41]
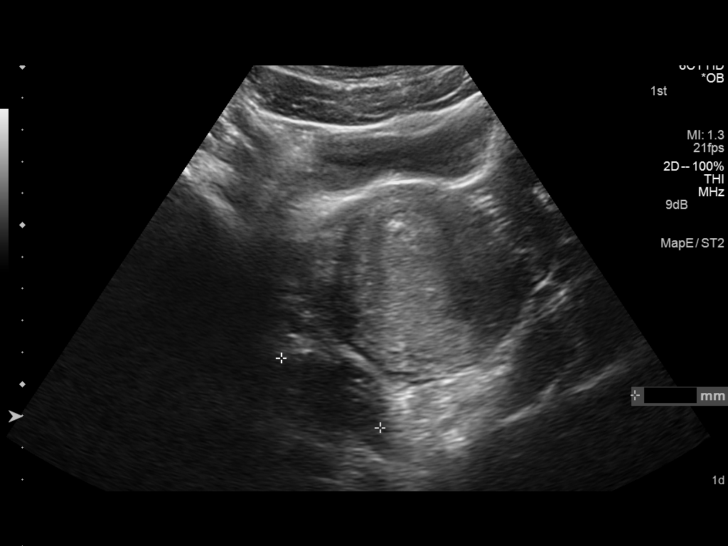
[im 20/41]
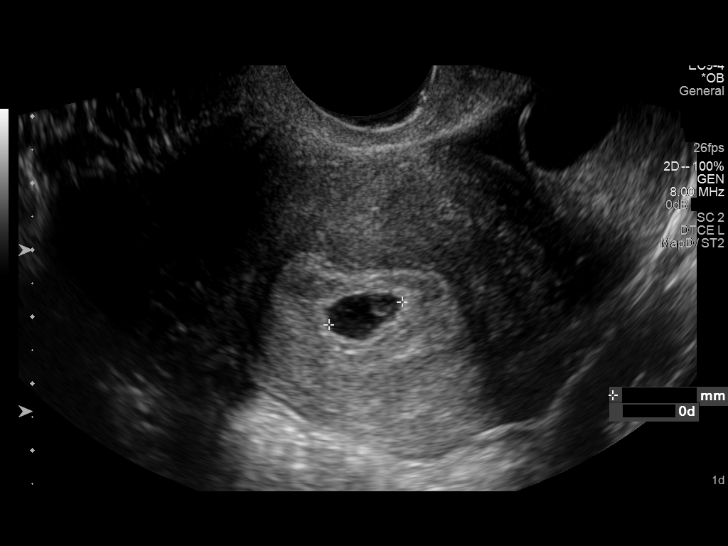
[im 23/41]
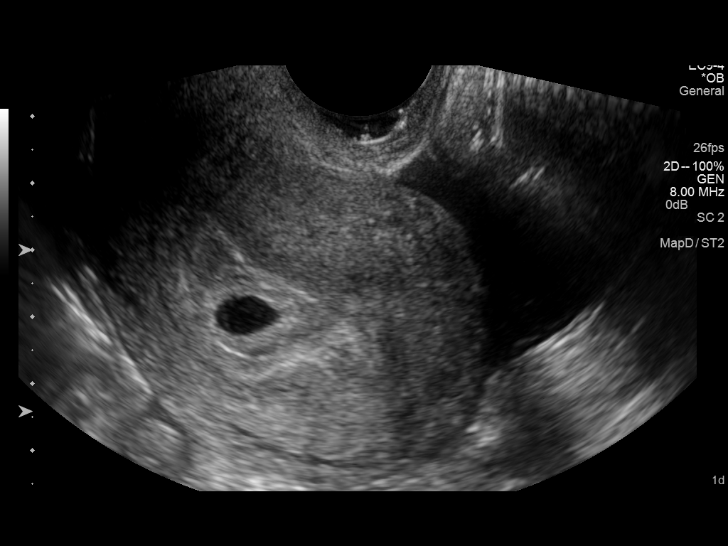
[im 26/41]
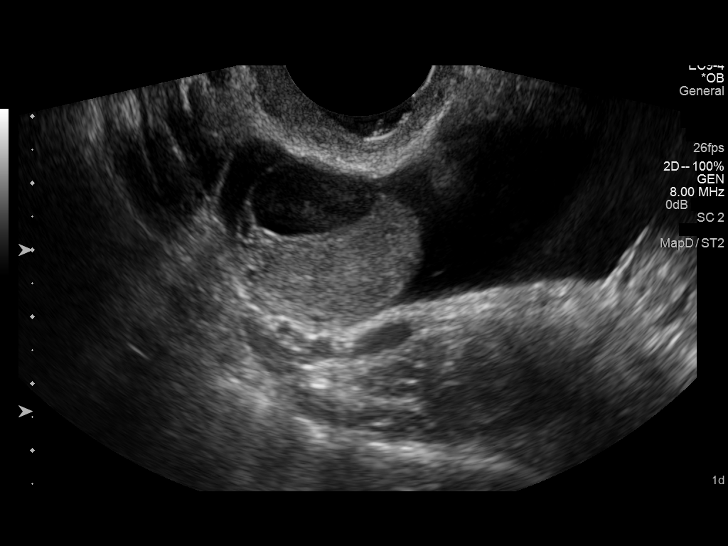
[im 29/41]
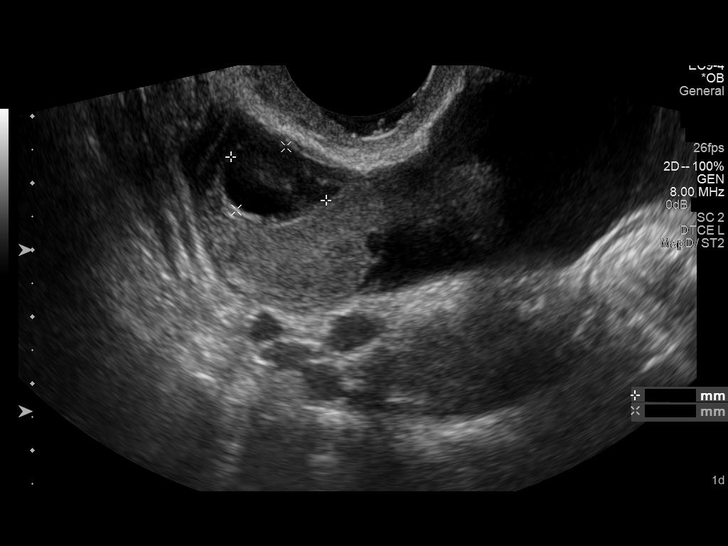
[im 32/41]
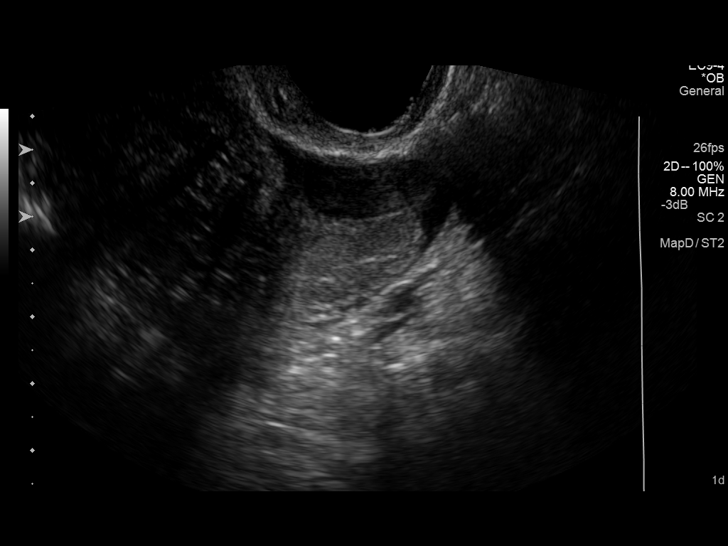
[im 35/41]
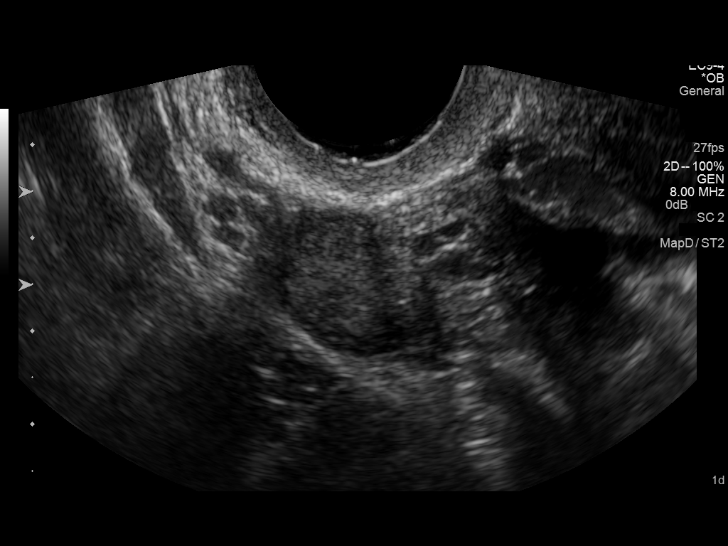
[im 38/41]
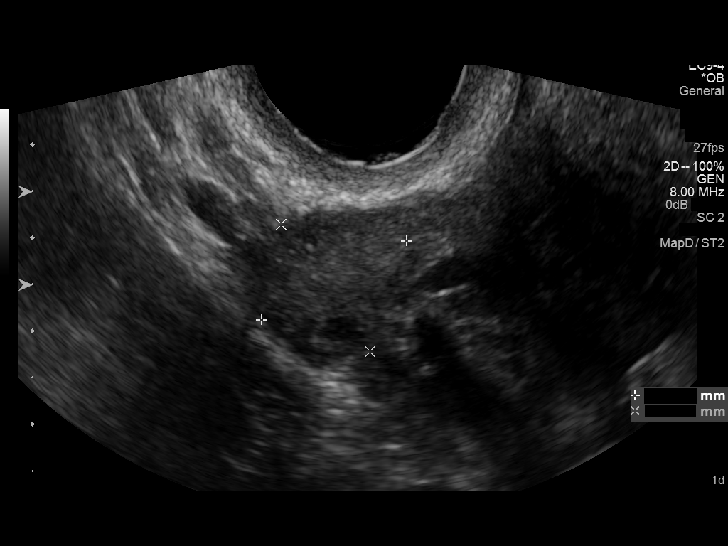
[im 41/41]
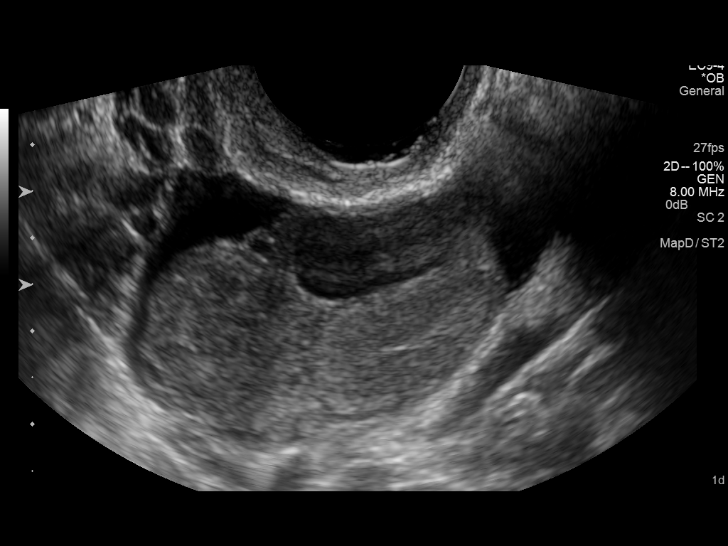

[14 of 28 positions shown; findings below may reference images not displayed]

FINDINGS: Intrauterine gestational sac: Visualized/normal in shape.

Yolk sac:  Present

Embryo:  Absent

Cardiac Activity: Absent

Heart Rate: Not applicable bpm

MSD: 7.6  mm   5 w   4  d

US EDC: 04/04/2015

Maternal uterus/adnexae: Physiologic appearance of the ovaries. No
subchorionic hemorrhage. Moderate amount of free fluid.
IMPRESSION: Uncomplicated single intrauterine pregnancy with gestational sac and
yolk sac visualized. Fetal pole not visualized due to early dates.

## 2016-07-03 IMAGING — US US OB TRANSVAGINAL
1 series · 13 of 28 positions shown · non-contrast
Comparison: August 06, 2014

CLINICAL DATA: Vaginal bleeding

EXAM:
OBSTETRIC <14 WK US AND TRANSVAGINAL OB US
TECHNIQUE: Study was performed transabdominally to optimize pelvic field of
view evaluation and transvaginally to optimize internal visceral
architecture evaluation.

[Series 1: us ob transvaginal · 0.22mm/px · 13 of 56 slices shown]
[im 3/56]
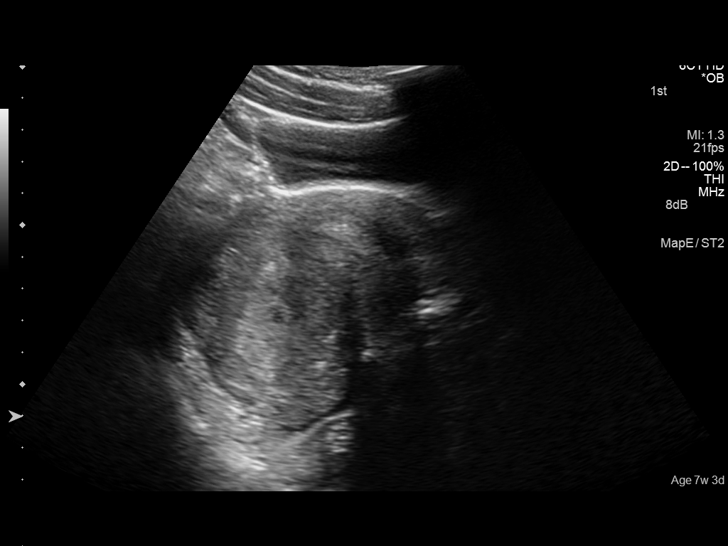
[im 7/56]
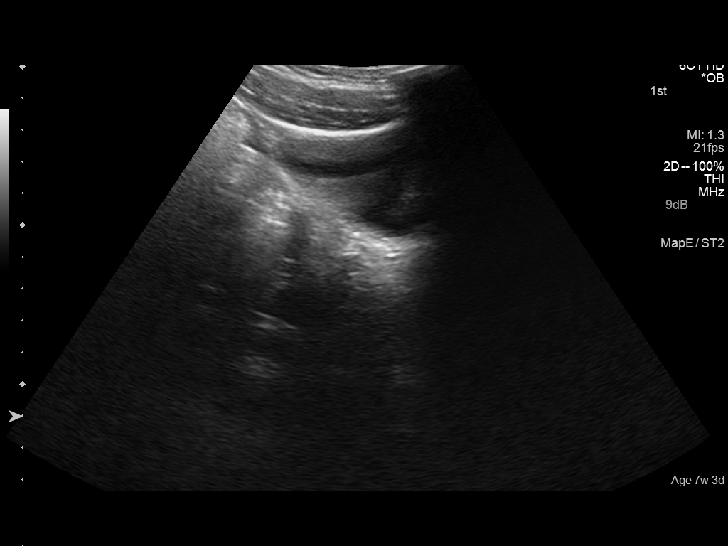
[im 11/56]
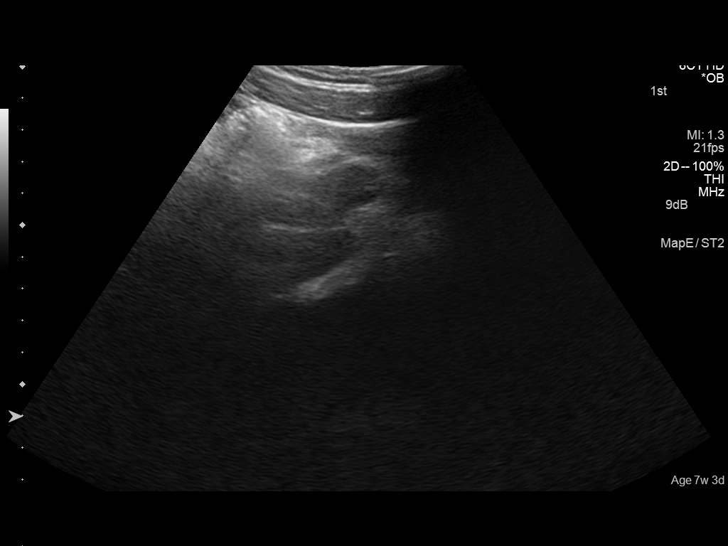
[im 15/56]
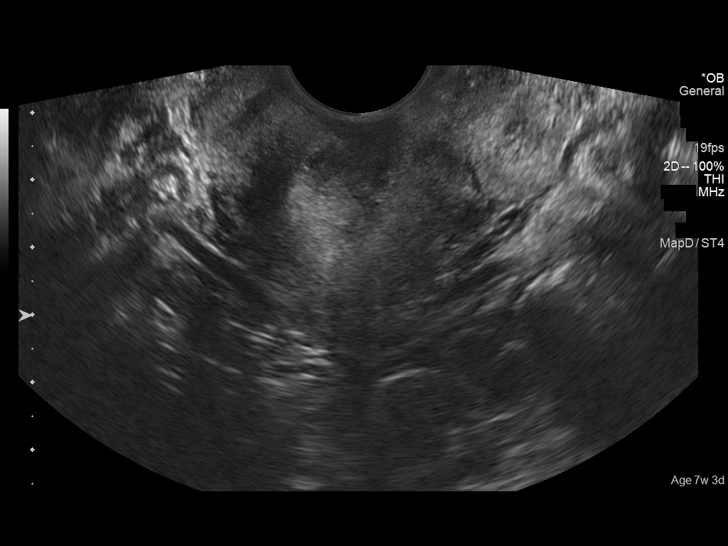
[im 19/56]
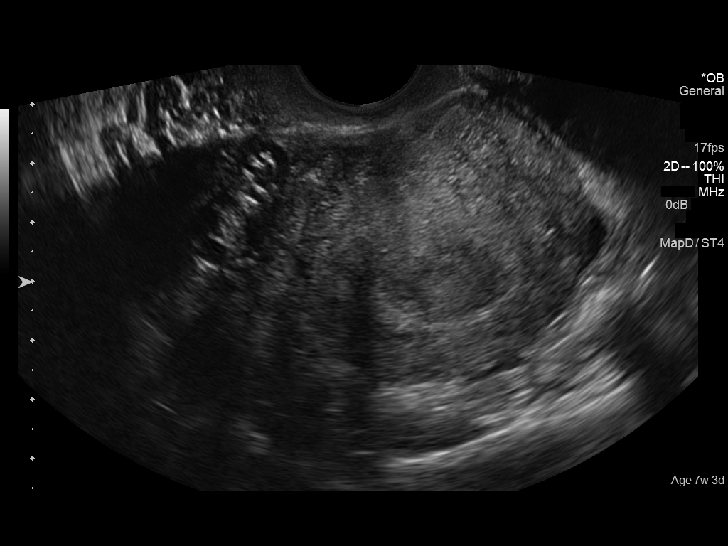
[im 23/56]
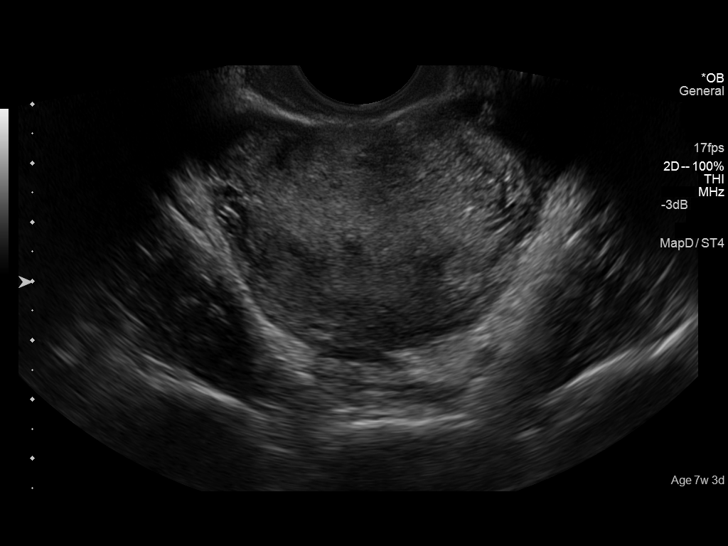
[im 29/56]
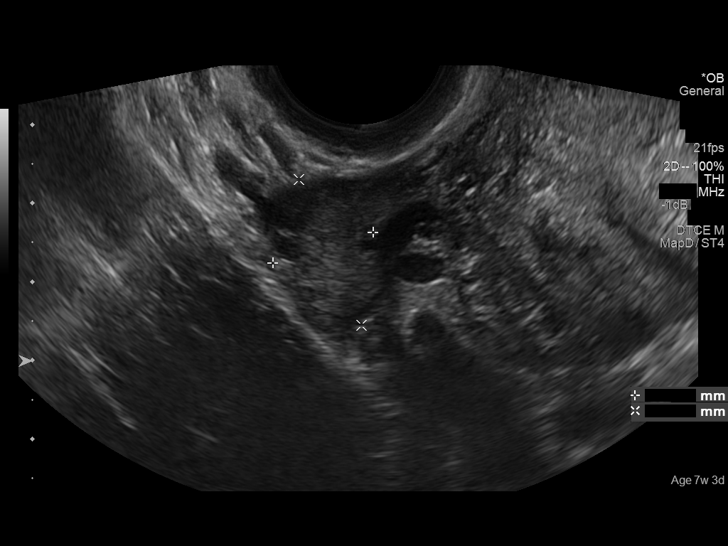
[im 33/56]
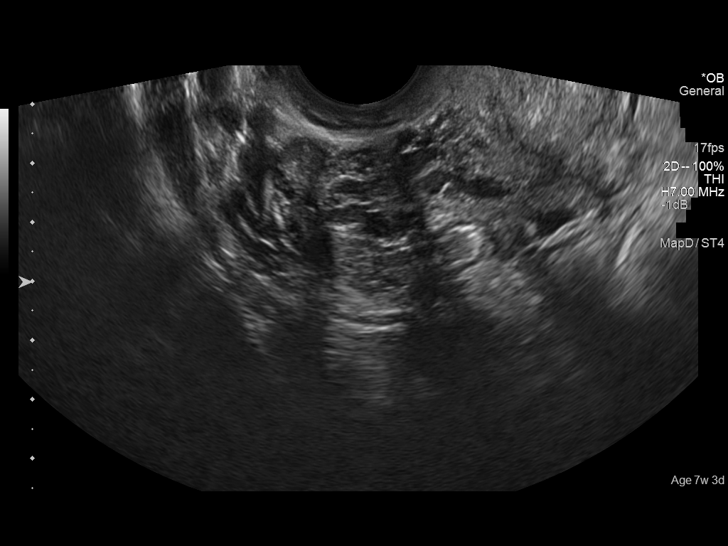
[im 37/56]
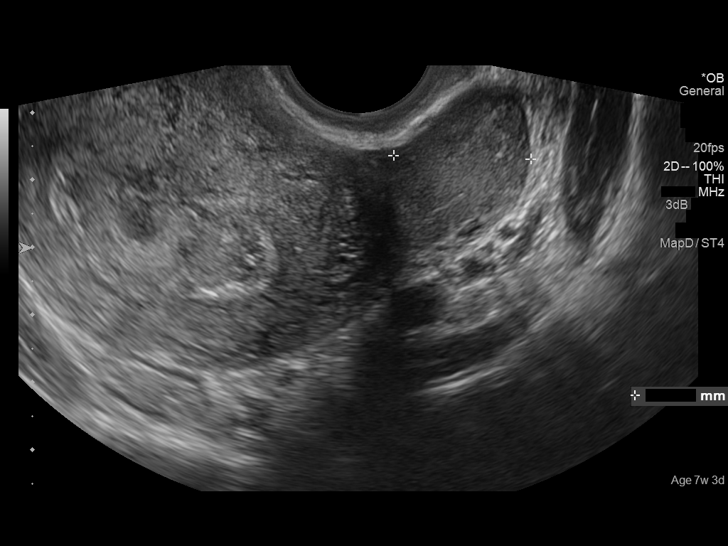
[im 41/56]
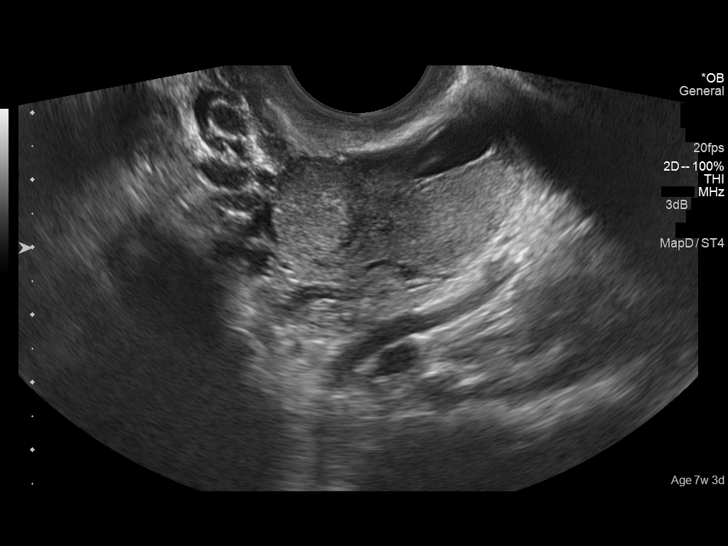
[im 45/56]
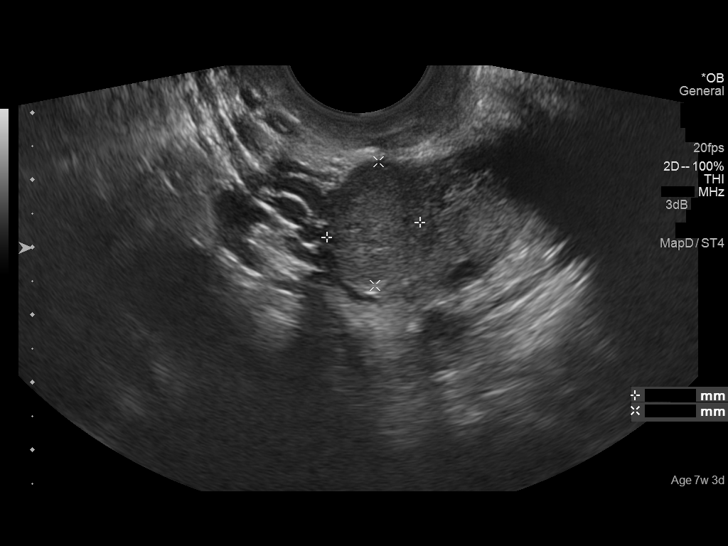
[im 49/56]
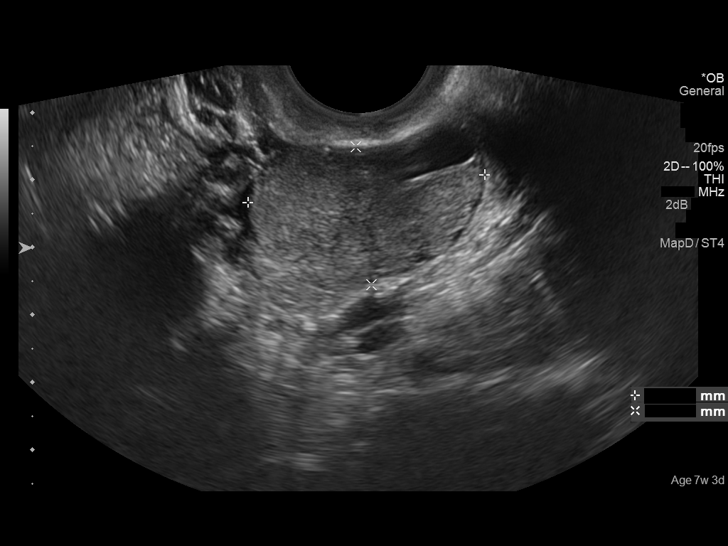
[im 53/56]
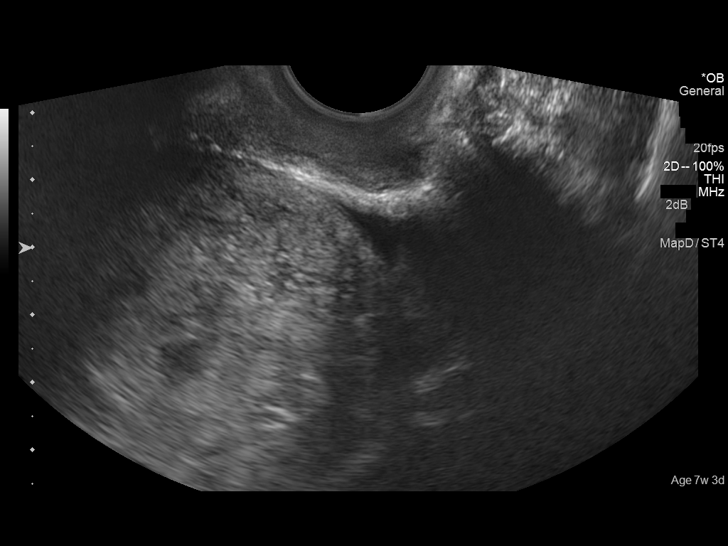

[13 of 28 positions shown; findings below may reference images not displayed]

FINDINGS: The previously noted gestational sac is no longer appreciable. The
endometrium currently is thickened with complex echogenic material
within the endometrium consistent with hemorrhage. Complex fluid
collection in the endometrium measures 1.8 x 1.4 x 1.5 cm. A degree
of retained products of conception cannot be excluded on this study.
Uterus otherwise appears unremarkable. Uterus is retroverted. It
measures 7.3 x 5.7 x 7.0 cm.

The right ovary measures 2.0 x 1.3 x 1.3 cm. Left ovary measures
x 2.1 x 2.0 cm. There is a follicle/corpus luteum in the left ovary
measuring 1.6 x 0.7 cm. No other extrauterine pelvic mass. A small
amount of free pelvic fluid may indicate recent ovarian cyst
leakage.
IMPRESSION: Findings consistent with spontaneous abortion. Gestational sac no
longer appreciable. There is complex material throughout the
endometrium consistent with hemorrhage in possible retained products
of conception. Close clinical evaluation with beta HCG correlation
advised. A repeat ultrasound in 2-3 days to further assess the
appearance of the endometrium may be warranted given this
circumstance as well.
